# Patient Record
Sex: Female | Born: 1985 | Race: White | Hispanic: No | Marital: Married | Smoking: Never smoker
Health system: Southern US, Community
[De-identification: ages and names within clinical notes are randomized; demographics above are authoritative.]

## PROBLEM LIST (undated history)

## (undated) DIAGNOSIS — K219 Gastro-esophageal reflux disease without esophagitis: Secondary | ICD-10-CM

## (undated) DIAGNOSIS — E282 Polycystic ovarian syndrome: Secondary | ICD-10-CM

## (undated) DIAGNOSIS — I10 Essential (primary) hypertension: Secondary | ICD-10-CM

## (undated) HISTORY — PX: TYMPANOSTOMY TUBE PLACEMENT: SHX32

## (undated) HISTORY — DX: Polycystic ovarian syndrome: E28.2

---

## 2006-10-02 ENCOUNTER — Ambulatory Visit: Payer: Self-pay | Admitting: Family Medicine

## 2007-08-18 ENCOUNTER — Ambulatory Visit: Payer: Self-pay | Admitting: Family Medicine

## 2007-09-03 ENCOUNTER — Emergency Department: Payer: Self-pay | Admitting: Internal Medicine

## 2007-12-12 IMAGING — CT CT ABD-PELV W/ CM
2 of 3 series · 14 of 42 positions shown, 18 images · non-contrast
Comparison: none

REASON FOR EXAM: eval for appendicitis  CALL REPORT [id number]
COMMENTS:

[Series 2: appendicitis · axial · 0.67mm/px · z∈[-450,-48]mm · 11 of 154 slices shown, 15 images]
[im 13/154  soft-tissue]
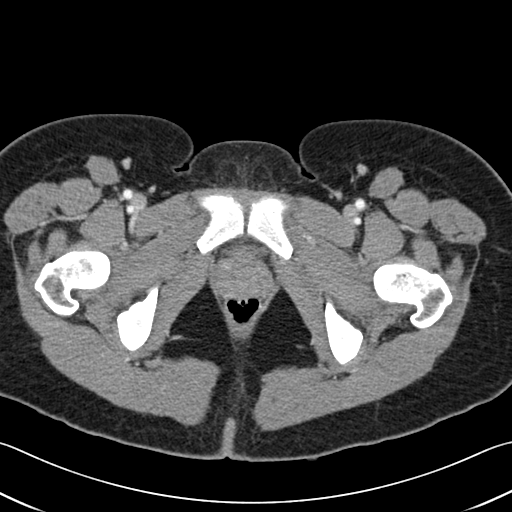
[im 13/154  bone]
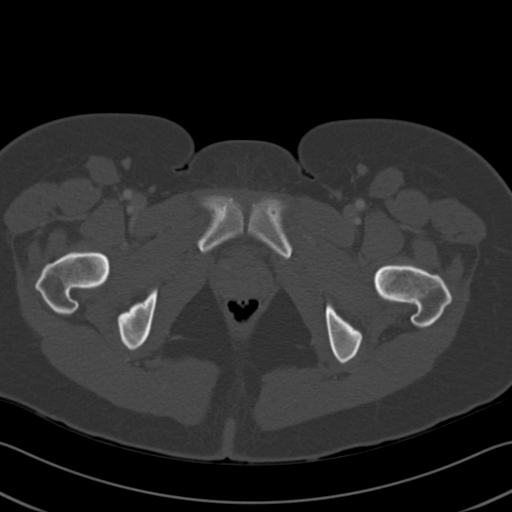
[im 26/154  soft-tissue]
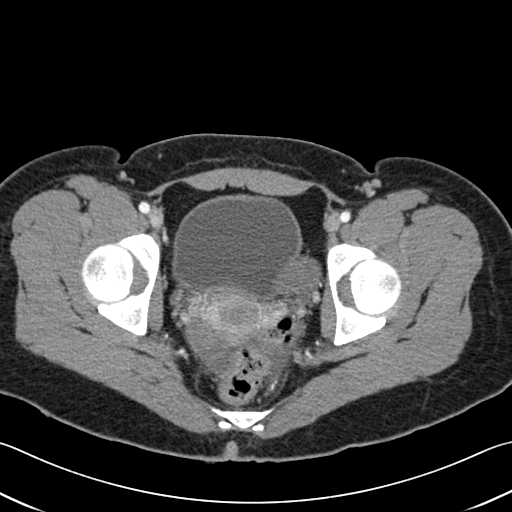
[im 45/154  soft-tissue]
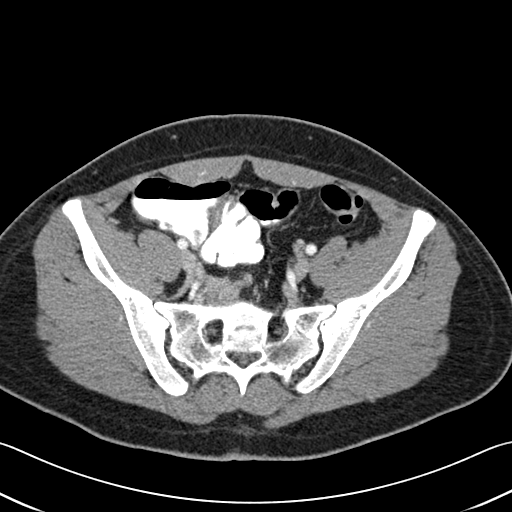
[im 58/154  soft-tissue]
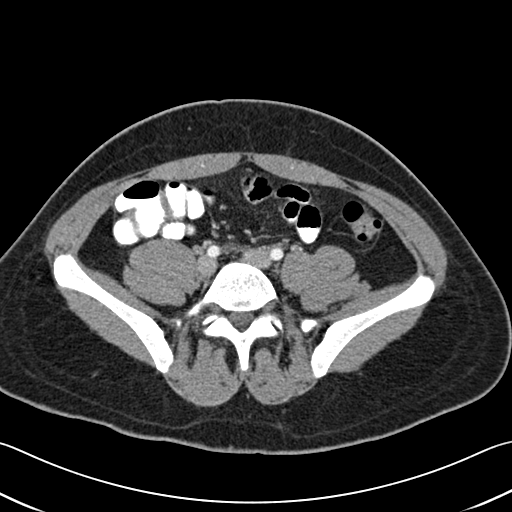
[im 77/154  soft-tissue]
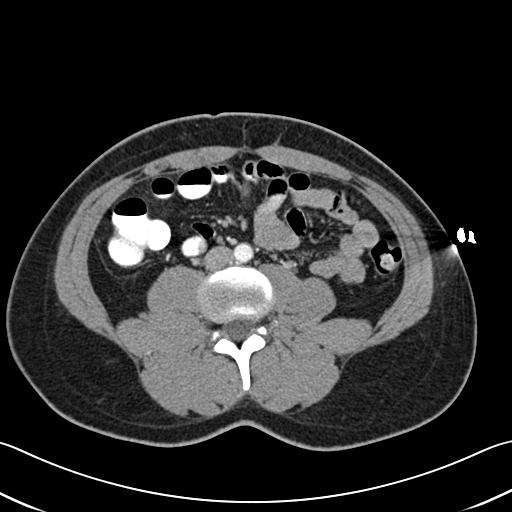
[im 96/154  soft-tissue]
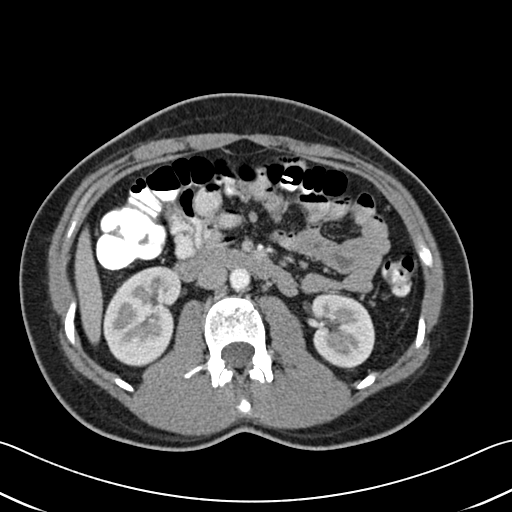
[im 109/154  soft-tissue]
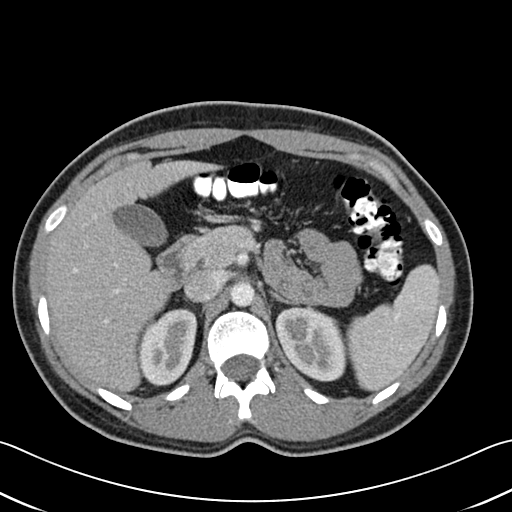
[im 128/154  soft-tissue]
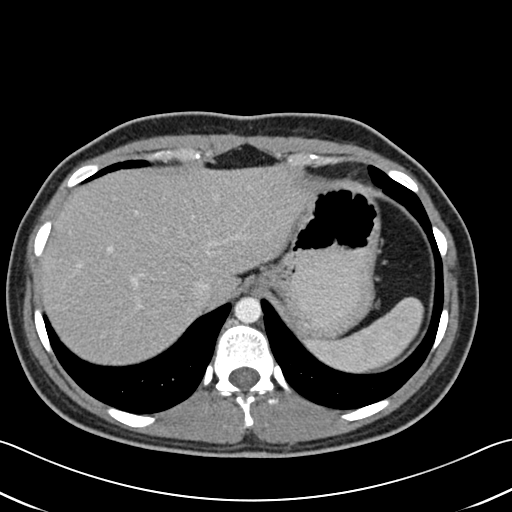
[im 128/154  lung]
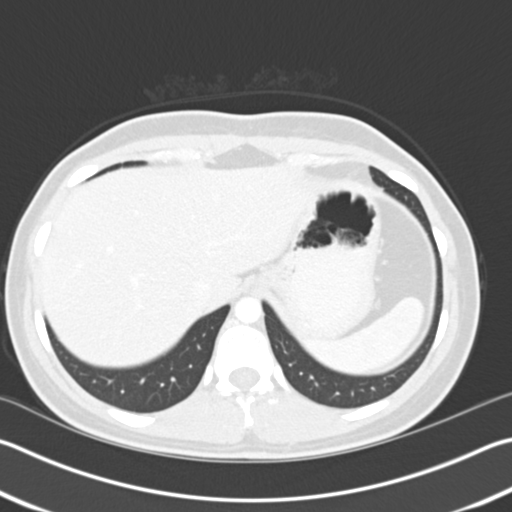
[im 134/154  lung]
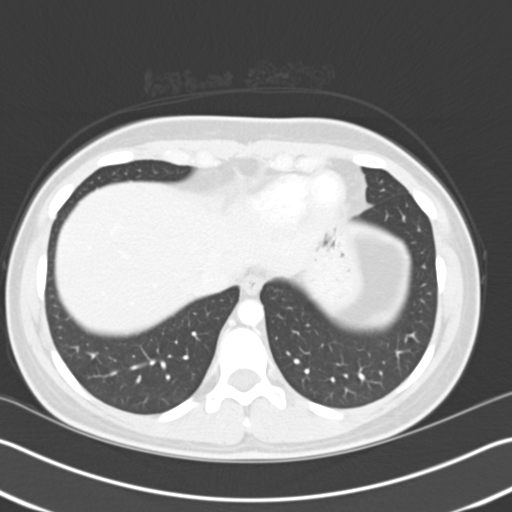
[im 141/154  soft-tissue]
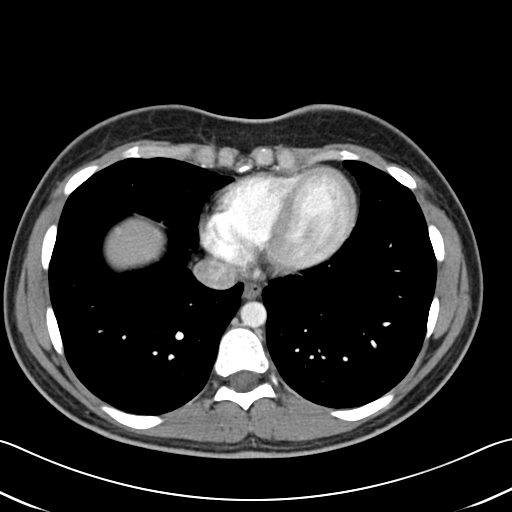
[im 141/154  lung]
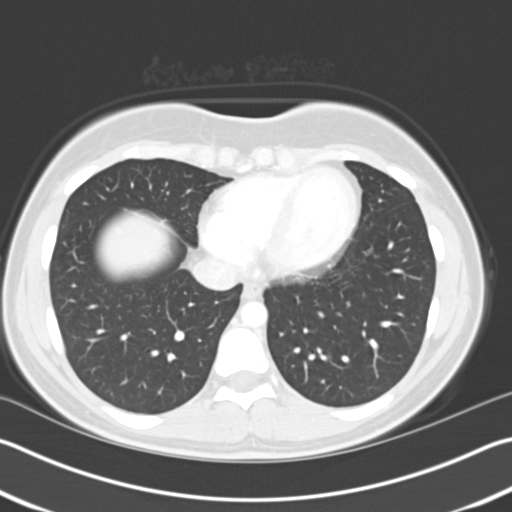
[im 141/154  bone]
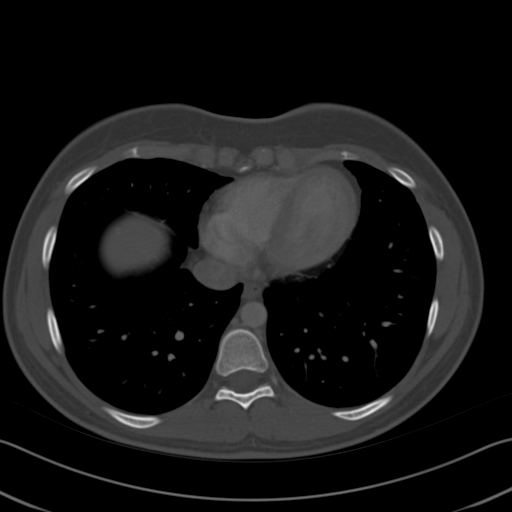
[im 147/154  lung]
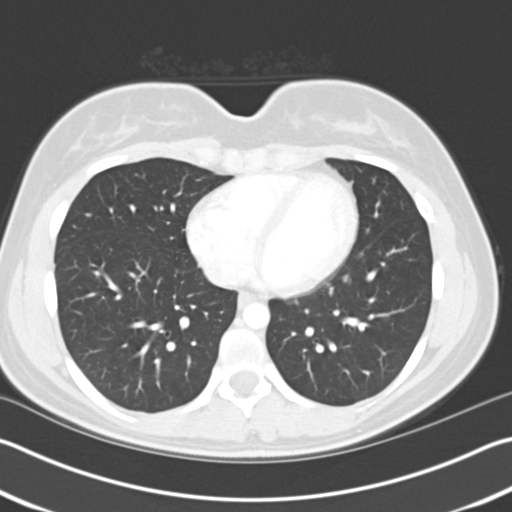

[Series 602: coronals · coronal · 0.92mm/px · 3 of 75 slices shown]
[im 25/75  soft-tissue]
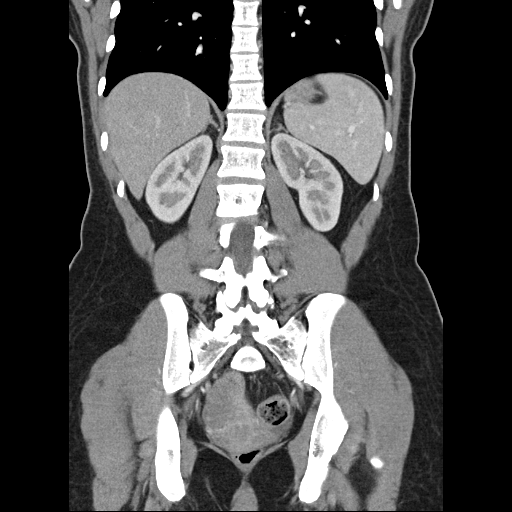
[im 33/75  soft-tissue]
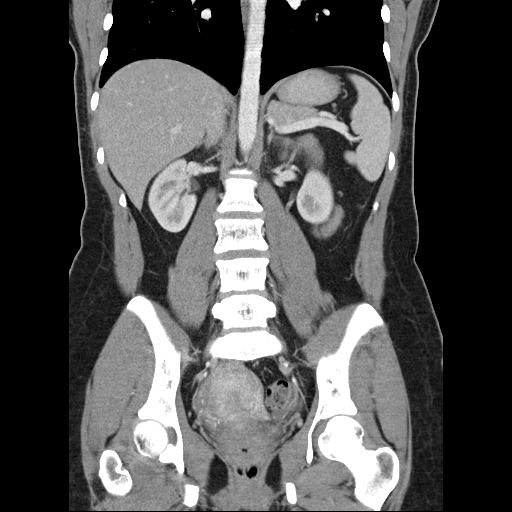
[im 42/75  soft-tissue]
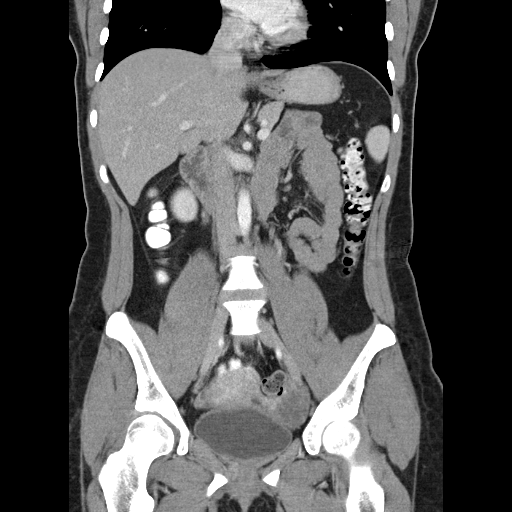

[14 of 42 positions shown; findings below may reference images not displayed]

PROCEDURE:     CT  - CT ABDOMEN / PELVIS  W  - October 02, 2006  [DATE]

RESULT:     The patient received 100 ml of Zsovue-I4D for this study as well
as oral contrast material. The patient is complaining of RIGHT lower
quadrant discomfort. Within the RIGHT lower quadrant of the abdomen, the
cecum is seen to be contrast-filled and normal in appearance. I do not see
pericecal inflammatory change. There is a structure which may reflect the
appendix which is normal. The cecum comes to lie in the midline in the upper
pelvis.

The partially distended urinary bladder is normal in appearance. There are
hypodensities in the adnexal region bilaterally. On the LEFT, this structure
measures 4.2 x 3.4 cm. On the RIGHT a similar hypodensity more posteriorly
positioned measures 4.8 x 1.9 cm. The uterus demonstrates relatively normal
size. There is a small amount of air present within the vaginal canal. The
sigmoid colon exhibits no acute abnormality.

There is no evidence of bowel obstruction.

Followup scanning is available if the patient's clinical condition warrants
this.

There are a few borderline enlarged mesenteric lymph nodes in the right
lower and mid abdomen. The caliber of the abdominal aorta is normal. The
liver, gallbladder, pancreas, adrenal glands, spleen, and partially
distended stomach are normal in appearance. The lung bases are clear.
IMPRESSION: 1. I do not see objective evidence of acute appendicitis.
2. There are complex appearing cystic structures in the adnexal regions
bilaterally. These are nonspecific and will merit further evaluation with
pelvic ultrasound. Correlation with the patient's laboratory values
including beta-hCG will be needed.
3. There is no evidence of ascites.
4. I see no acute abnormality of the urinary tracts nor of the hepatobiliary
tree.

This report was discussed by telephone with Dr. Shimi. A followup pelvic
ultrasound is planned to assess the adnexal regions. Followup CT scanning
may be indicated and continued close clinical followup will be needed.

## 2007-12-12 IMAGING — US US PELV - US TRANSVAGINAL
1 series · 17 of 25 positions shown · non-contrast
Comparison: none

REASON FOR EXAM: CT showed cyst questioning rupture RLQ pain elevated
White cell count  [id number]
COMMENTS:

[Series 1: us pelv - us transvaginal · 17 of 37 slices shown]
[im 1/37]
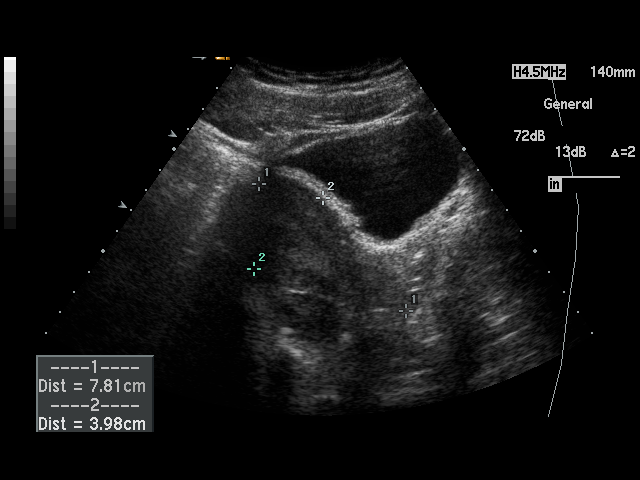
[im 4/37]
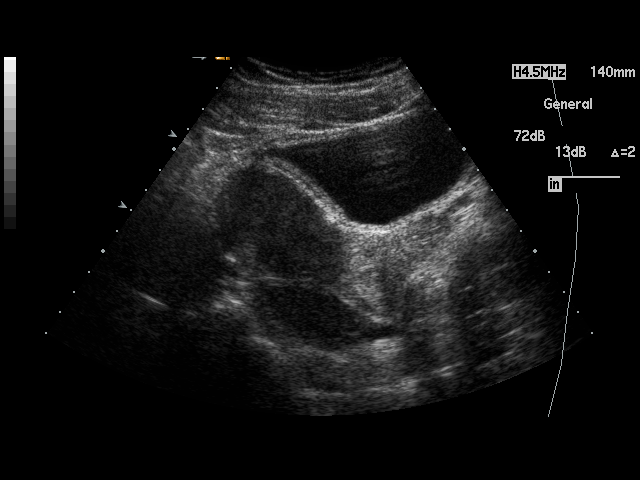
[im 5/37]
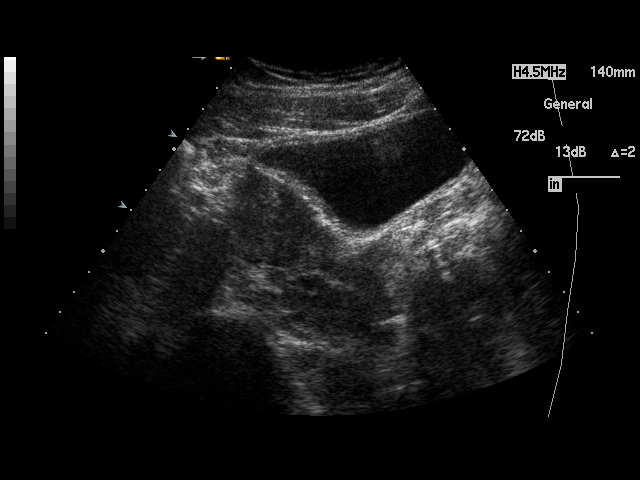
[im 8/37]
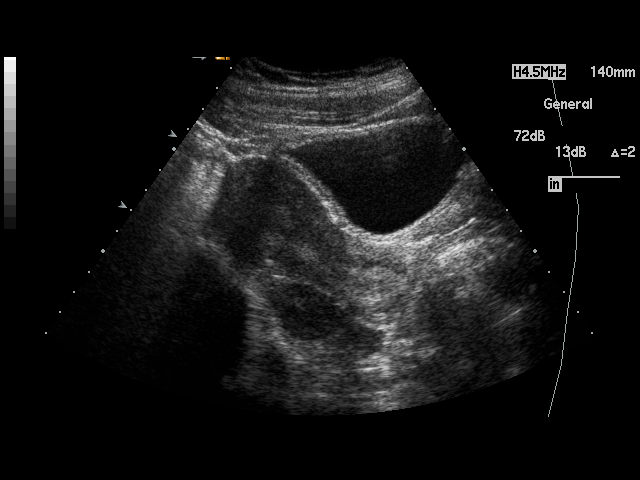
[im 10/37]
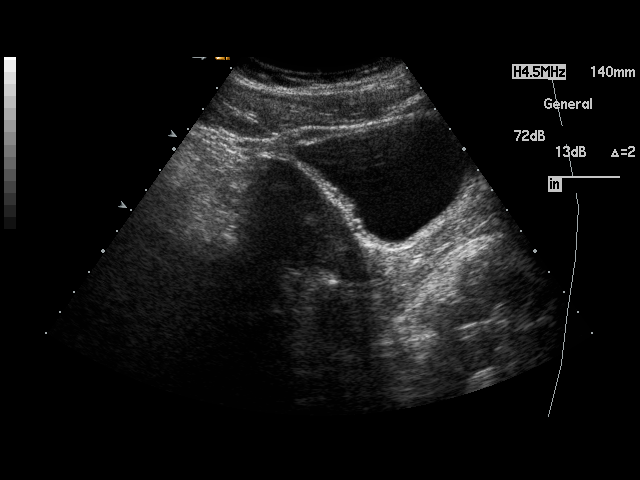
[im 13/37]
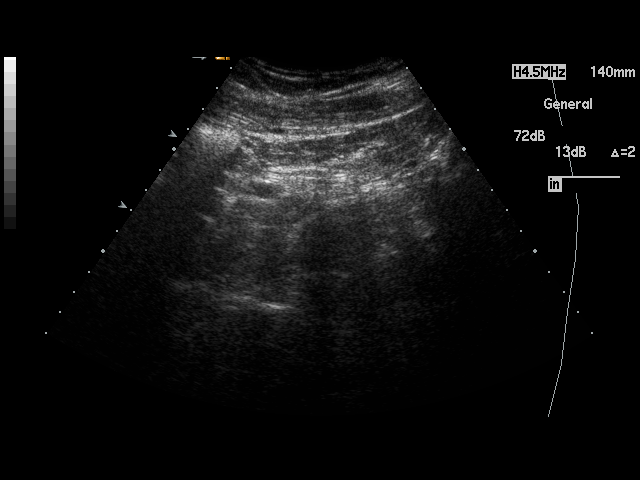
[im 14/37]
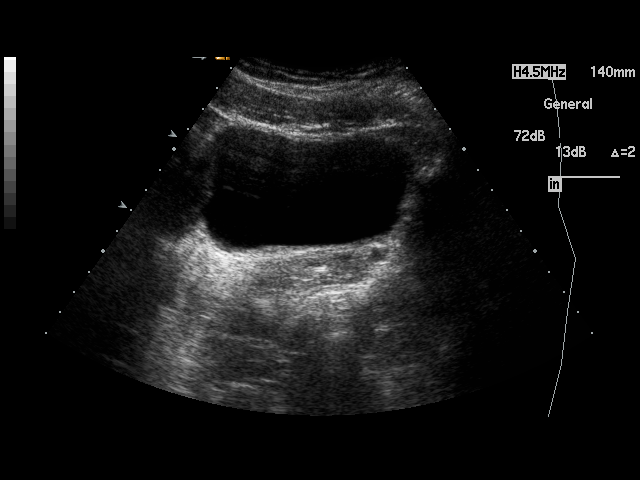
[im 17/37]
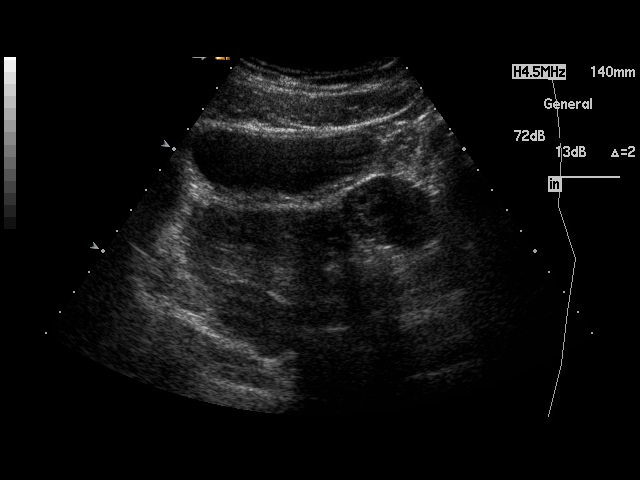
[im 19/37]
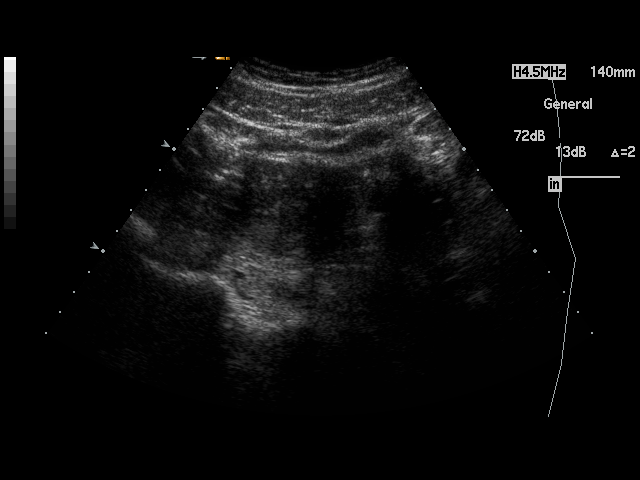
[im 20/37]
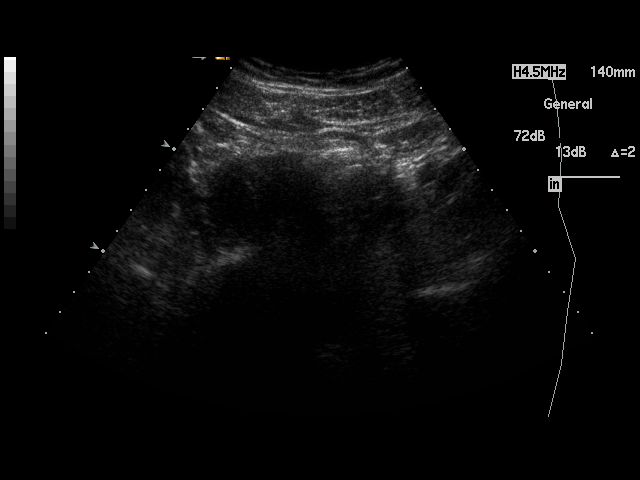
[im 23/37]
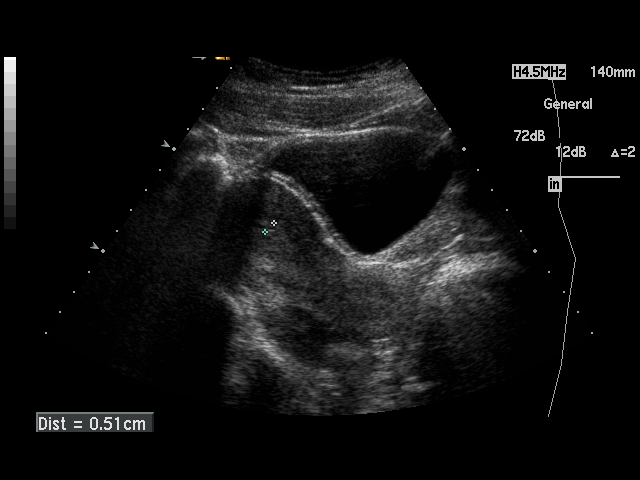
[im 25/37]
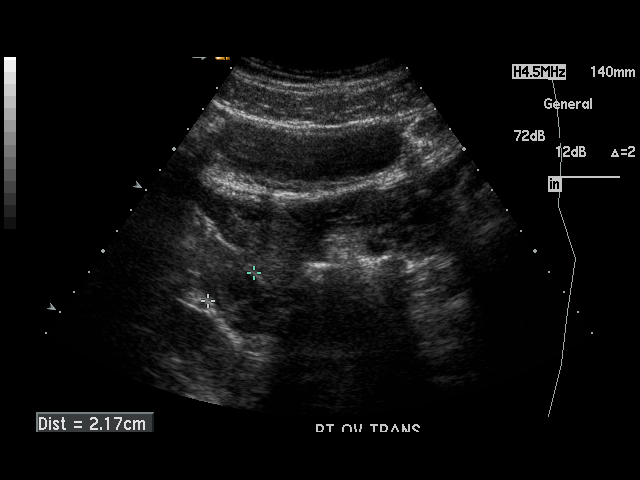
[im 28/37]
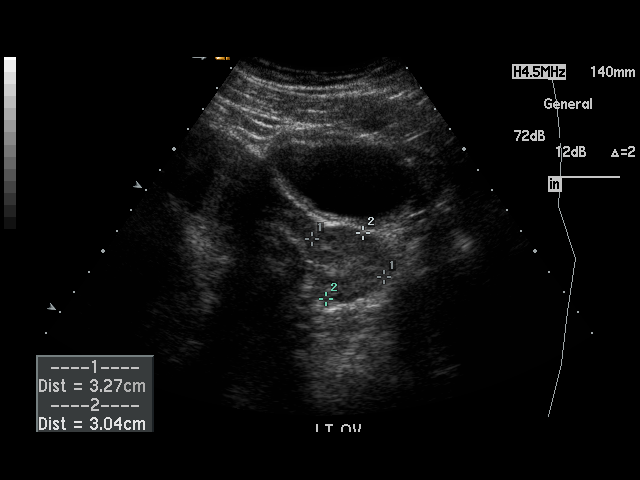
[im 29/37]
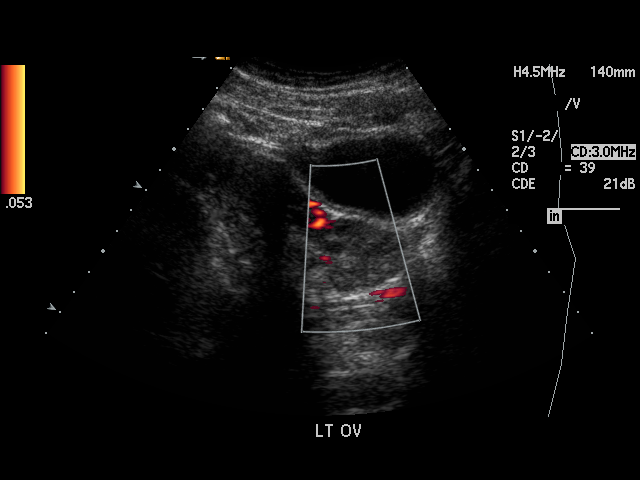
[im 32/37]
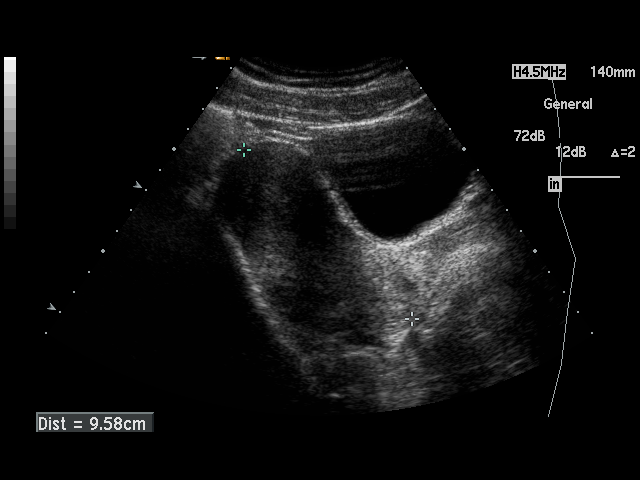
[im 34/37]
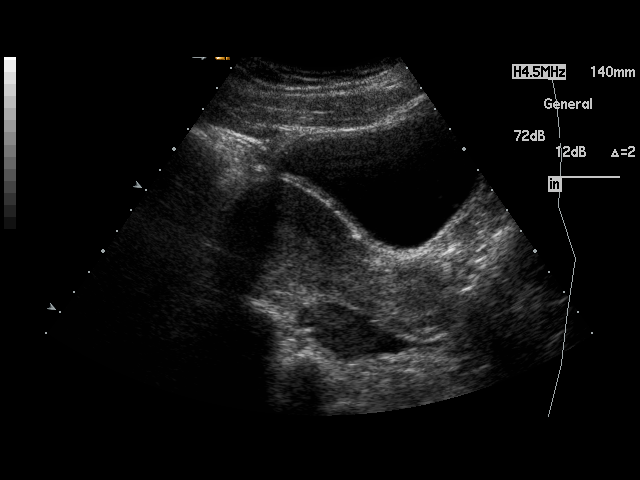
[im 37/37]
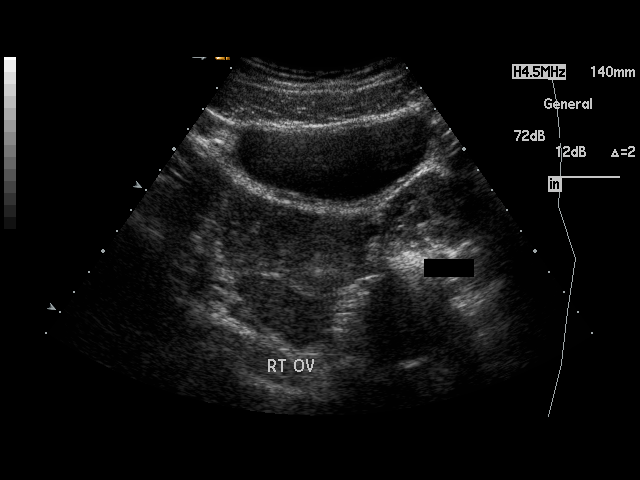

[17 of 25 positions shown; findings below may reference images not displayed]

PROCEDURE:     US  - US PELVIS MASS EXAM  - [DATE] [DATE] [DATE]  [DATE]

RESULT:     The uterus measures 9.58 cm x 3.89 cm x 4.42 cm.  The
endometrium measures 5.1 mm in thickness. No uterine mass lesions are seen.
The RIGHT and LEFT ovaries are visualized. The RIGHT ovary measures 3.54 cm
at maximum diameter and the LEFT ovary measures 3.63cm at maximum diameter.
No abnormal adnexal masses are. There is a nonspecific trace of free fluid
in the cul-de-sac. The visualized portion of the urinary bladder is normal
in appearance.
IMPRESSION: No significant abnormalities are identified. There is a nonspecific trace of
fluid in the cul-de-sac. The possible pelvic cystic structures noted on the
CT of this date are not identified sonographically.

## 2008-11-12 IMAGING — US US PELV - US TRANSVAGINAL
1 series · 17 of 25 positions shown · non-contrast
Comparison: Time 6361

REASON FOR EXAM: pain rlq        rm 7
COMMENTS:

PROCEDURE:     US  - US PELVIS MASS EXAM W/TRANSVAGI  - September 03, 2007  [DATE]
RESULT:     History: 22-year-old female presents for abdominal pain

[Series 1: us pelv - us transvaginal · 17 of 76 slices shown]
[im 1/76]
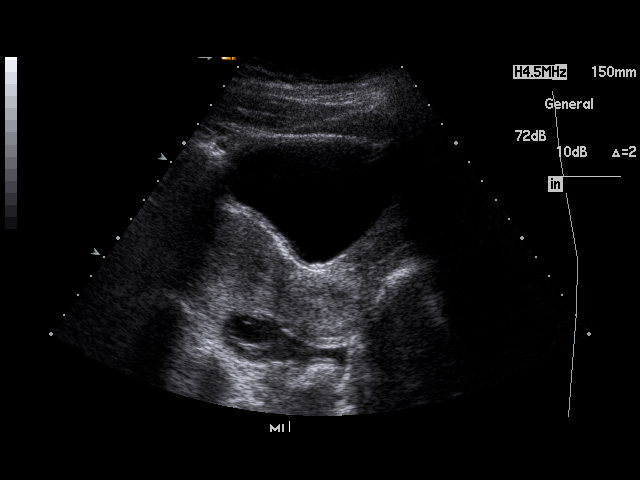
[im 7/76]
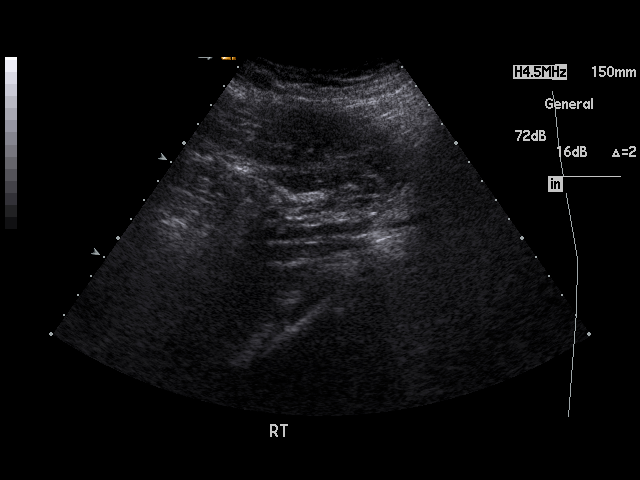
[im 10/76]
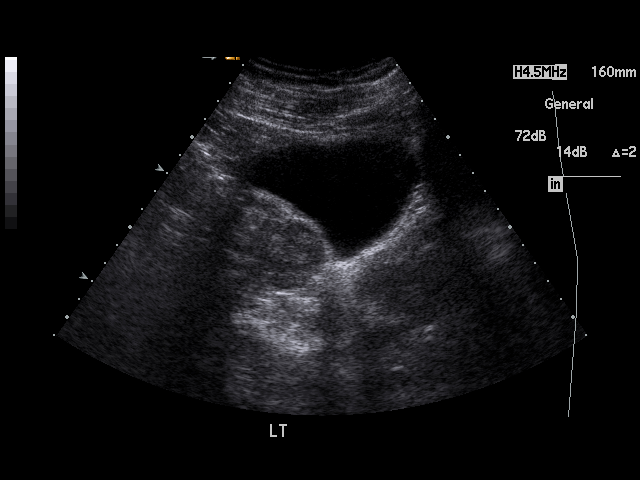
[im 16/76]
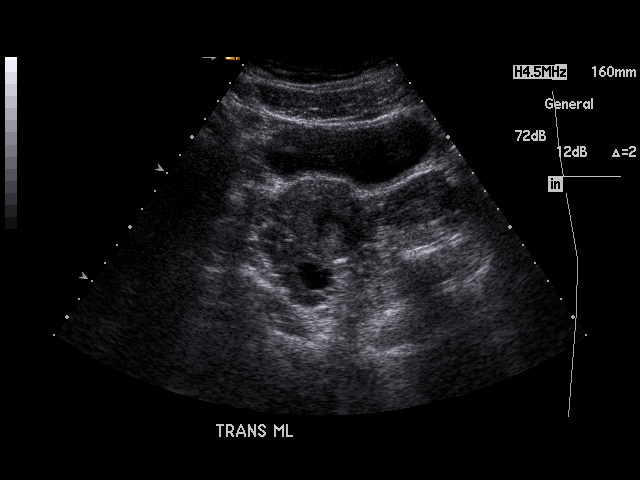
[im 19/76]
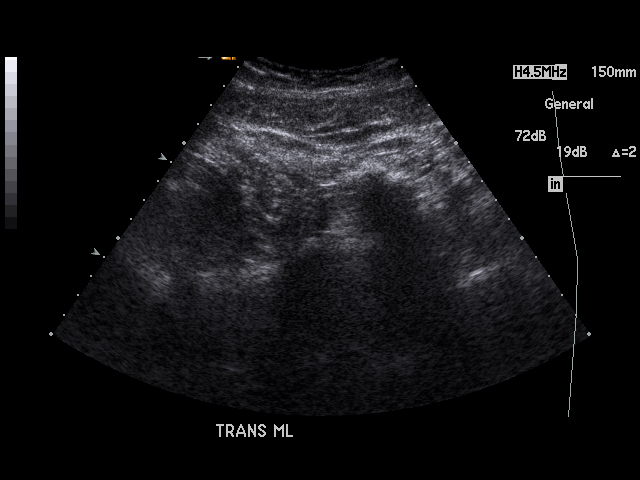
[im 26/76]
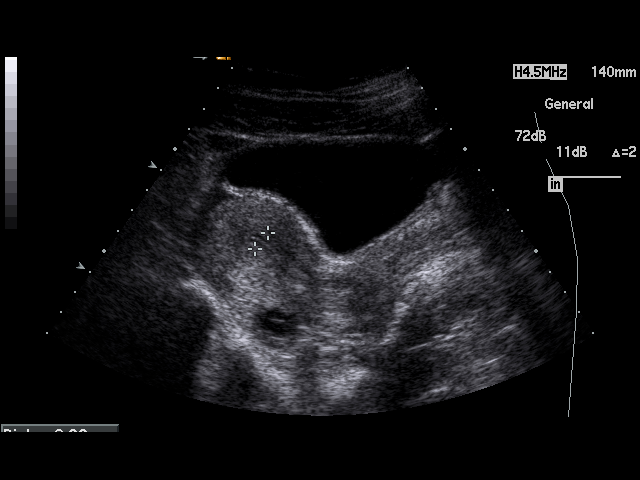
[im 29/76]
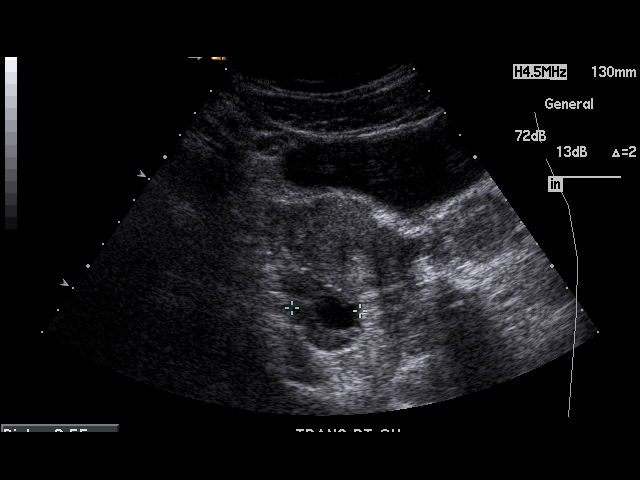
[im 35/76]
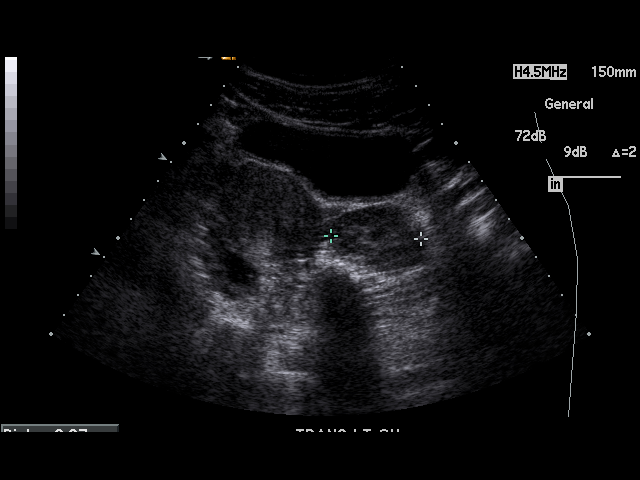
[im 38/76]
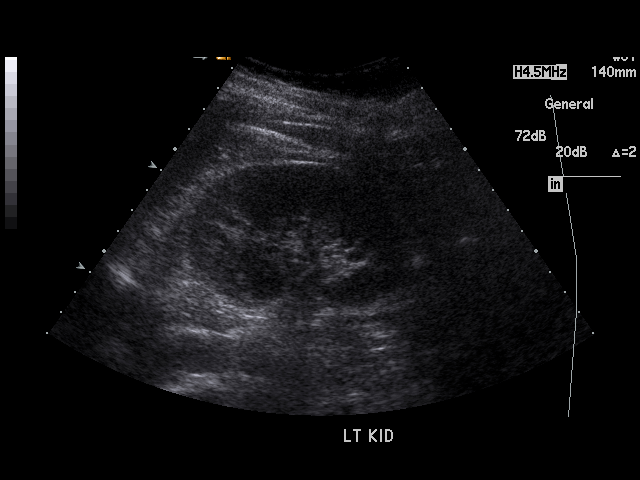
[im 41/76]
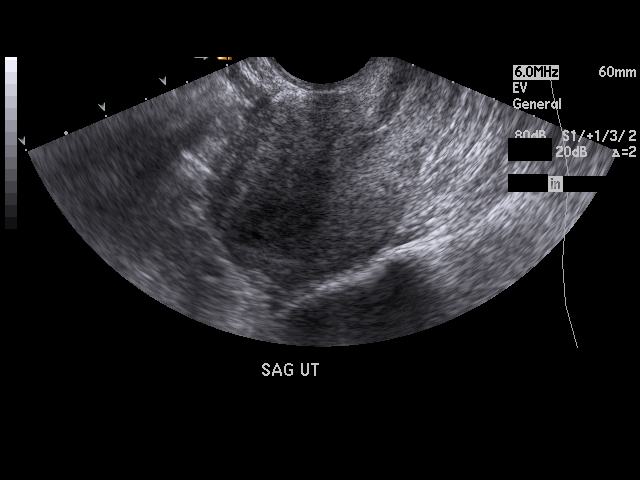
[im 47/76]
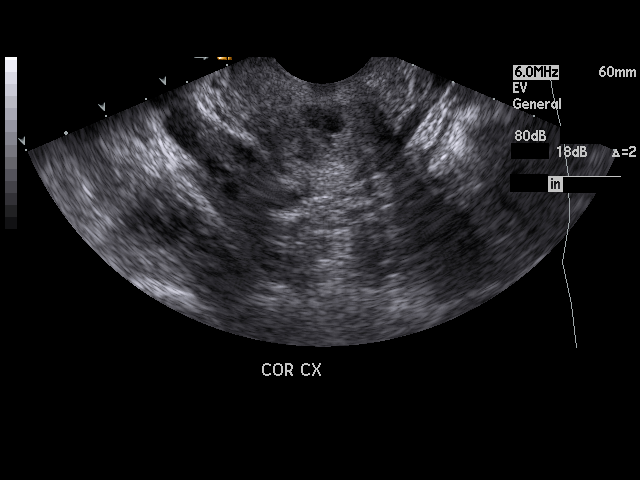
[im 51/76]
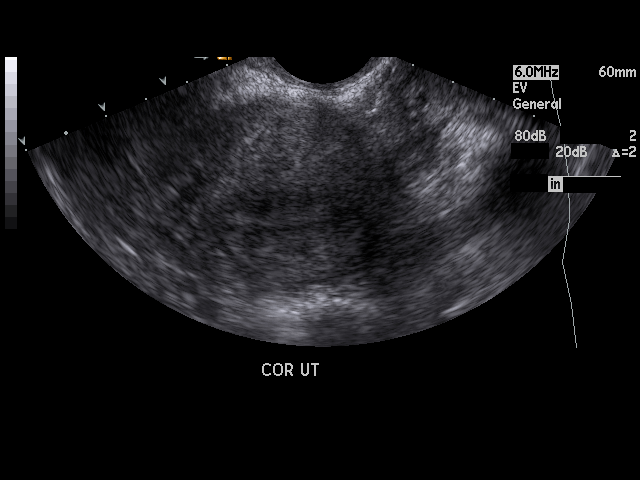
[im 57/76]
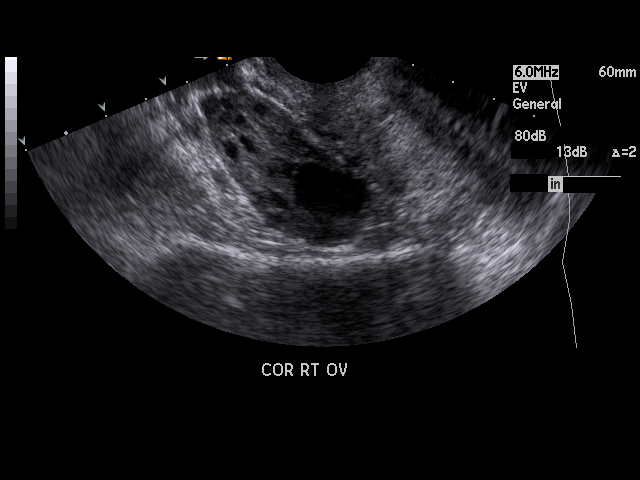
[im 60/76]
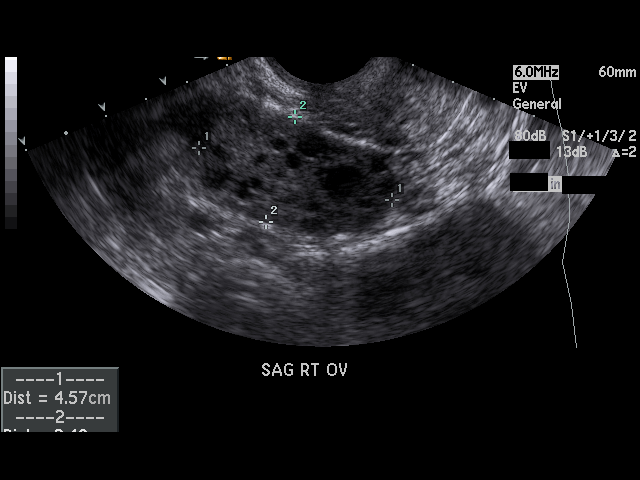
[im 66/76]
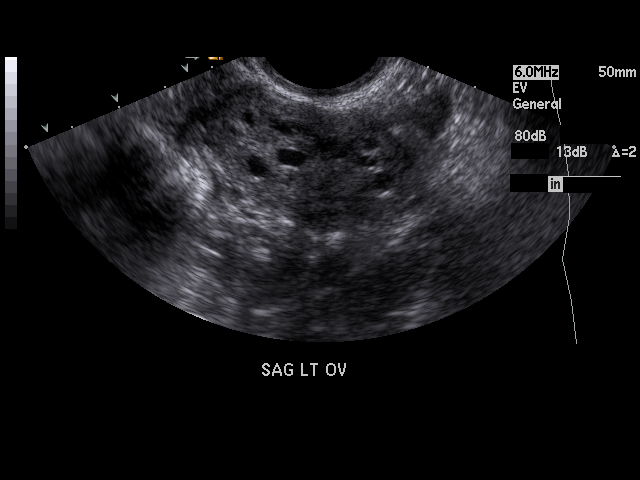
[im 69/76]
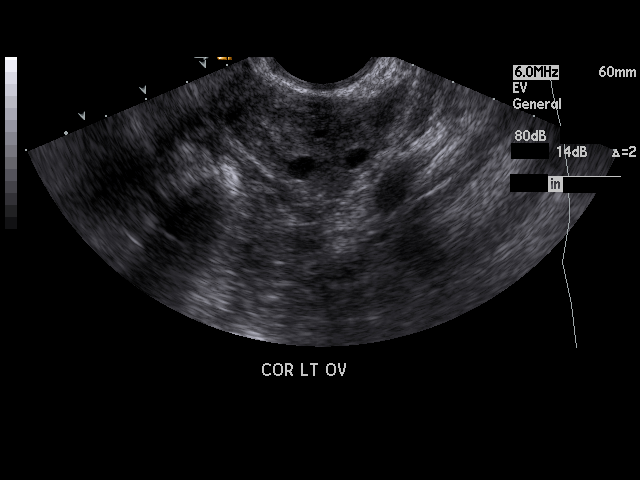
[im 76/76]
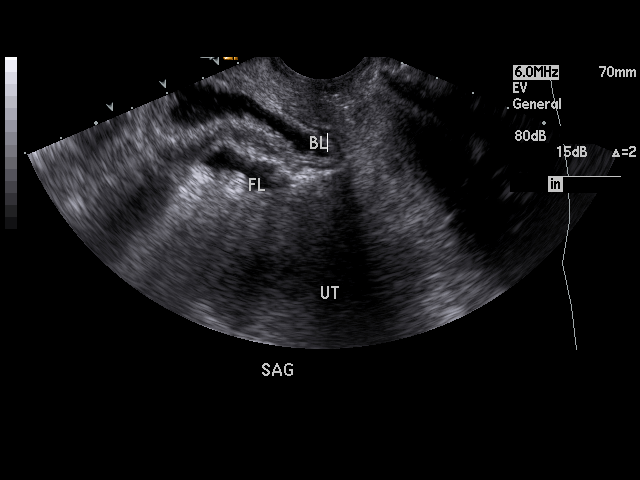

[17 of 25 positions shown; findings below may reference images not displayed]

FINDINGS: Real-time transabdominal and transvaginal ultrasound of the pelvis
performed.

Left nostril. The probe.

The uterus is normal and size and contour. The uterus measures 7.7 x 4 x
cm. The endometrial stripe is normal measuring 5.2 mm.

Bilateral ovaries are normal in size and echotexture. The right ovary
measures 4.6 x 1.9 x 2.5 cm. The left ovary measures 4.1 x 3.7 x 2.4 cm.
There is no evidence of an ovarian mass. There is a dominant right ovarian
follicle measuring 1.8 x 1.7 x 1.2 cm. There is a trace amount of fluid in
the cul-de-sac.
IMPRESSION: Unremarkable pelvic ultrasound trace amount of pelvic free fluid which may
be physiologic.

## 2011-02-04 HISTORY — PX: TONSILLECTOMY: SUR1361

## 2011-04-02 ENCOUNTER — Ambulatory Visit: Payer: Self-pay | Admitting: Otolaryngology

## 2013-05-23 ENCOUNTER — Ambulatory Visit: Payer: Self-pay | Admitting: Internal Medicine

## 2015-04-13 ENCOUNTER — Encounter: Payer: Self-pay | Admitting: Family Medicine

## 2015-04-13 ENCOUNTER — Ambulatory Visit (INDEPENDENT_AMBULATORY_CARE_PROVIDER_SITE_OTHER): Payer: BLUE CROSS/BLUE SHIELD | Admitting: Family Medicine

## 2015-04-13 VITALS — BP 118/88 | HR 90 | Temp 98.0°F | Resp 20 | Ht 64.0 in | Wt 180.5 lb

## 2015-04-13 DIAGNOSIS — Z7189 Other specified counseling: Secondary | ICD-10-CM

## 2015-04-13 DIAGNOSIS — Z7689 Persons encountering health services in other specified circumstances: Secondary | ICD-10-CM

## 2015-04-13 NOTE — Progress Notes (Signed)
Name: Jill Colon   MRN: [id number]    DOB: Nov 06, 1985   Date:04/13/2015       Progress Note  Subjective  Chief Complaint  Chief Complaint  Patient presents with  . est care    HPI  Pt. Is here to establish care. Last PCP was Dr. Oran Rein. Records not available for review  Past Medical History  Diagnosis Date  . PCOS (polycystic ovarian syndrome)     Past Surgical History  Procedure Laterality Date  . Tonsillectomy  2013  . Tympanostomy tube placement      Twice during childhood    Family History  Problem Relation Age of Onset  . Hypertension Mother   . Hyperlipidemia Father   . Hypertension Father   . Anemia Father     Social History   Social History  . Marital Status: Single    Spouse Name: N/A  . Number of Children: N/A  . Years of Education: N/A   Occupational History  . Not on file.   Social History Main Topics  . Smoking status: Never Smoker   . Smokeless tobacco: Not on file  . Alcohol Use: 0.6 oz/week    0 Standard drinks or equivalent, 1 Glasses of wine per week  . Drug Use: No  . Sexual Activity: Yes   Other Topics Concern  . Not on file   Social History Narrative  . No narrative on file    No current outpatient prescriptions on file.  Allergies  Allergen Reactions  . Augmentin [Amoxicillin-Pot Clavulanate] Rash     ROS    Objective  Filed Vitals:   04/13/15 1020  BP: 118/88  Pulse: 90  Temp: 98 F (36.7 C)  Resp: 20  Height: 5\' 4"  (1.626 m)  Weight: 180 lb 8 oz (81.874 kg)  SpO2: 97%    Physical Exam  Constitutional: She is well-developed, well-nourished, and in no distress.  HENT:  Head: Normocephalic and atraumatic.  Cardiovascular: Normal rate and regular rhythm.   Murmur heard.  Systolic murmur is present with a grade of 2/6  Pulmonary/Chest: Breath sounds normal.  Nursing note and vitals reviewed.   Assessment & Plan  1. Encounter to establish care with new doctor Request records. Patient will  return for a complete physical exam.   Jill Colon [facility] 04/13/2015 10:40 AM

## 2015-04-27 ENCOUNTER — Ambulatory Visit (INDEPENDENT_AMBULATORY_CARE_PROVIDER_SITE_OTHER): Payer: BLUE CROSS/BLUE SHIELD | Admitting: Family Medicine

## 2015-04-27 ENCOUNTER — Encounter: Payer: Self-pay | Admitting: Family Medicine

## 2015-04-27 VITALS — BP 118/80 | HR 100 | Temp 99.0°F | Resp 18 | Ht 64.0 in | Wt 179.2 lb

## 2015-04-27 DIAGNOSIS — R0981 Nasal congestion: Secondary | ICD-10-CM | POA: Diagnosis not present

## 2015-04-27 DIAGNOSIS — Z0000 Encounter for general adult medical examination without abnormal findings: Secondary | ICD-10-CM | POA: Diagnosis not present

## 2015-04-27 MED ORDER — AZITHROMYCIN 250 MG PO TABS: ORAL_TABLET | ORAL | Status: DC

## 2015-04-27 NOTE — Progress Notes (Signed)
Name: Jill Colon   MRN: [id number]    DOB: 1985/03/23   Date:04/27/2015       Progress Note  Subjective  Chief Complaint  Chief Complaint  Patient presents with  . Annual Exam    HPI  Pt. Presents for a Complete Physical exam.  Her last Pap Smear was  5 years ago. She has history of PCOS, was being followed by Gynecology (three Gynecologists at [place]).  Patient also requesting prescription for Z-Pak as she believes she may have sinus and nasal inflammation, if left untreated, this leads to a full-blown sinus infection. In the past, she has taken Z-Pak for similar symptoms, which helped relieve her symptoms. Past Medical History  Diagnosis Date  . PCOS (polycystic ovarian syndrome)     Past Surgical History  Procedure Laterality Date  . Tonsillectomy  2013  . Tympanostomy tube placement      Twice during childhood    Family History  Problem Relation Age of Onset  . Hypertension Mother   . Hyperlipidemia Father   . Hypertension Father   . Anemia Father     Social History   Social History  . Marital Status: Single    Spouse Name: N/A  . Number of Children: N/A  . Years of Education: N/A   Occupational History  . Not on file.   Social History Main Topics  . Smoking status: Never Smoker   . Smokeless tobacco: Not on file  . Alcohol Use: 0.6 oz/week    0 Standard drinks or equivalent, 1 Glasses of wine per week  . Drug Use: No  . Sexual Activity: Yes   Other Topics Concern  . Not on file   Social History Narrative    No current outpatient prescriptions on file.  Allergies  Allergen Reactions  . Augmentin [Amoxicillin-Pot Clavulanate] Rash     Review of Systems  Constitutional: Negative for fever, chills and malaise/fatigue.  HENT: Positive for congestion.   Eyes: Negative for blurred vision, double vision and pain.  Respiratory: Positive for cough. Negative for shortness of breath and wheezing.   Cardiovascular: Negative for chest  pain and palpitations.  Gastrointestinal: Negative for heartburn, nausea and vomiting.  Genitourinary: Negative for dysuria, urgency and hematuria.  Musculoskeletal: Negative for myalgias and back pain.  Neurological: Positive for headaches (headache last night). Negative for dizziness.  Psychiatric/Behavioral: Negative for depression. The patient is not nervous/anxious.     Objective  Filed Vitals:   04/27/15 1114  BP: 118/80  Pulse: 100  Temp: 99 F (37.2 C)  Resp: 18  Height: 5\' 4"  (1.626 m)  Weight: 179 lb 4 oz (81.307 kg)  SpO2: 98%    Physical Exam  Constitutional: She is oriented to person, place, and time and well-developed, well-nourished, and in no distress.  HENT:  Head: Normocephalic and atraumatic.  Left Ear: Tympanic membrane and ear canal normal.  Nose: Right sinus exhibits no maxillary sinus tenderness and no frontal sinus tenderness. Left sinus exhibits no maxillary sinus tenderness and no frontal sinus tenderness.  Mouth/Throat: No posterior oropharyngeal erythema.  R. Ear TM not visualized, canal normal, no discharge Nasal mucosal inflammation and turbinate hypertrophy.  Cardiovascular: Normal rate and regular rhythm.   Pulmonary/Chest: Effort normal and breath sounds normal. She has no wheezes. She has no rhonchi.  Abdominal: Soft. Bowel sounds are normal. There is no tenderness.  Genitourinary: Vagina normal, uterus normal and cervix normal. Vagina exhibits normal mucosa and no lesion. No vaginal discharge found.  Musculoskeletal:       Right ankle: She exhibits no swelling.       Left ankle: She exhibits no swelling.  Neurological: She is alert and oriented to person, place, and time.  Nursing note and vitals reviewed.     Assessment & Plan  1. Annual physical exam Obtained age-appropriate laboratory screening studies. - Pap IG w/ reflex to HPV when ASC-U - Lipid Profile - CBC with Differential - Comprehensive Metabolic Panel (CMET) - TSH -  Vitamin D (25 hydroxy)  2. Nasal sinus congestion We'll provide prescription for Z-Pak, but advised patient to only take if her symptoms progress despite conservative therapy. Pt. Verbalized agreement. - azithromycin (ZITHROMAX) 250 MG tablet; 2 tabs po x day 1, then 1 tab po q day x 4 days  Dispense: 6 tablet; Refill: 0    Ladona Rosten [facility] 04/27/2015 11:57 AM

## 2015-05-01 LAB — PAP IG W/ RFLX HPV ASCU: PAP Smear Comment: 0

## 2015-05-09 LAB — CBC WITH DIFFERENTIAL/PLATELET
BASOS: 0 %
Basophils Absolute: 0 10*3/uL (ref 0.0–0.2)
EOS (ABSOLUTE): 0.3 10*3/uL (ref 0.0–0.4)
EOS: 3 %
HEMATOCRIT: 38.6 % (ref 34.0–46.6)
Hemoglobin: 12.6 g/dL (ref 11.1–15.9)
IMMATURE GRANS (ABS): 0 10*3/uL (ref 0.0–0.1)
IMMATURE GRANULOCYTES: 0 %
LYMPHS: 33 %
Lymphocytes Absolute: 2.9 10*3/uL (ref 0.7–3.1)
MCH: 26.8 pg (ref 26.6–33.0)
MCHC: 32.6 g/dL (ref 31.5–35.7)
MCV: 82 fL (ref 79–97)
MONOS ABS: 0.5 10*3/uL (ref 0.1–0.9)
Monocytes: 6 %
NEUTROS PCT: 58 %
Neutrophils Absolute: 5 10*3/uL (ref 1.4–7.0)
PLATELETS: 332 10*3/uL (ref 150–379)
RBC: 4.7 x10E6/uL (ref 3.77–5.28)
RDW: 14.2 % (ref 12.3–15.4)
WBC: 8.7 10*3/uL (ref 3.4–10.8)

## 2015-05-09 LAB — COMPREHENSIVE METABOLIC PANEL
A/G RATIO: 1.5 (ref 1.2–2.2)
ALT: 13 IU/L (ref 0–32)
AST: 13 IU/L (ref 0–40)
Albumin: 4.4 g/dL (ref 3.5–5.5)
Alkaline Phosphatase: 38 IU/L — ABNORMAL LOW (ref 39–117)
BUN/Creatinine Ratio: 19 (ref 9–23)
BUN: 14 mg/dL (ref 6–20)
Bilirubin Total: 0.3 mg/dL (ref 0.0–1.2)
CALCIUM: 9.5 mg/dL (ref 8.7–10.2)
CO2: 22 mmol/L (ref 18–29)
CREATININE: 0.74 mg/dL (ref 0.57–1.00)
Chloride: 100 mmol/L (ref 96–106)
GFR, EST AFRICAN AMERICAN: 127 mL/min/{1.73_m2} (ref >59)
GFR, EST NON AFRICAN AMERICAN: 110 mL/min/{1.73_m2} (ref >59)
Globulin, Total: 2.9 g/dL (ref 1.5–4.5)
Glucose: 104 mg/dL — ABNORMAL HIGH (ref 65–99)
POTASSIUM: 4.7 mmol/L (ref 3.5–5.2)
Sodium: 138 mmol/L (ref 134–144)
TOTAL PROTEIN: 7.3 g/dL (ref 6.0–8.5)

## 2015-05-09 LAB — LIPID PANEL
CHOLESTEROL TOTAL: 170 mg/dL (ref 100–199)
Chol/HDL Ratio: 3.1 ratio units (ref 0.0–4.4)
HDL: 55 mg/dL (ref >39)
LDL Calculated: 100 mg/dL — ABNORMAL HIGH (ref 0–99)
Triglycerides: 77 mg/dL (ref 0–149)
VLDL Cholesterol Cal: 15 mg/dL (ref 5–40)

## 2015-05-09 LAB — VITAMIN D 25 HYDROXY (VIT D DEFICIENCY, FRACTURES): VIT D 25 HYDROXY: 40 ng/mL (ref 30.0–100.0)

## 2015-05-09 LAB — TSH: TSH: 3.23 u[IU]/mL (ref 0.450–4.500)

## 2015-06-08 ENCOUNTER — Ambulatory Visit: Payer: BLUE CROSS/BLUE SHIELD | Admitting: Family Medicine

## 2016-05-13 ENCOUNTER — Encounter: Payer: Self-pay | Admitting: Family Medicine

## 2016-05-13 ENCOUNTER — Ambulatory Visit (INDEPENDENT_AMBULATORY_CARE_PROVIDER_SITE_OTHER): Payer: BLUE CROSS/BLUE SHIELD | Admitting: Family Medicine

## 2016-05-13 VITALS — BP 126/81 | HR 96 | Temp 97.3°F | Resp 17 | Ht 64.0 in | Wt 195.7 lb

## 2016-05-13 DIAGNOSIS — R739 Hyperglycemia, unspecified: Secondary | ICD-10-CM | POA: Diagnosis not present

## 2016-05-13 DIAGNOSIS — R5383 Other fatigue: Secondary | ICD-10-CM | POA: Diagnosis not present

## 2016-05-13 LAB — CBC WITH DIFFERENTIAL/PLATELET
BASOS PCT: 1 %
Basophils Absolute: 101 cells/uL (ref 0–200)
Eosinophils Absolute: 202 cells/uL (ref 15–500)
Eosinophils Relative: 2 %
HEMATOCRIT: 38.2 % (ref 35.0–45.0)
HEMOGLOBIN: 12.5 g/dL (ref 11.7–15.5)
LYMPHS ABS: 2323 {cells}/uL (ref 850–3900)
Lymphocytes Relative: 23 %
MCH: 26.9 pg — ABNORMAL LOW (ref 27.0–33.0)
MCHC: 32.7 g/dL (ref 32.0–36.0)
MCV: 82.2 fL (ref 80.0–100.0)
MONO ABS: 505 {cells}/uL (ref 200–950)
MPV: 9.9 fL (ref 7.5–12.5)
Monocytes Relative: 5 %
NEUTROS ABS: 6969 {cells}/uL (ref 1500–7800)
Neutrophils Relative %: 69 %
Platelets: 402 10*3/uL — ABNORMAL HIGH (ref 140–400)
RBC: 4.65 MIL/uL (ref 3.80–5.10)
RDW: 14 % (ref 11.0–15.0)
WBC: 10.1 10*3/uL (ref 3.8–10.8)

## 2016-05-13 LAB — POCT GLYCOSYLATED HEMOGLOBIN (HGB A1C): HEMOGLOBIN A1C: 6

## 2016-05-13 LAB — TSH: TSH: 2.4 m[IU]/L

## 2016-05-13 NOTE — Progress Notes (Signed)
Name: Jill Colon   MRN: [id number]    DOB: 1985-11-18   Date:05/13/2016       Progress Note  Subjective  Chief Complaint  Chief Complaint  Patient presents with  . Follow-up    Thyroid check    HPI  Pt. Is here to have her thyroid levels checked, she has been feeling sluggish, fatigue, dry skin, and has gained some weight. She has no history of thyroid disorder in her family. TSH obtained in April 2017 was within normal range.   Past Medical History:  Diagnosis Date  . PCOS (polycystic ovarian syndrome)     Past Surgical History:  Procedure Laterality Date  . TONSILLECTOMY  2013  . TYMPANOSTOMY TUBE PLACEMENT     Twice during childhood    Family History  Problem Relation Age of Onset  . Hypertension Mother   . Hyperlipidemia Father   . Hypertension Father   . Anemia Father     Social History   Social History  . Marital status: Single    Spouse name: N/A  . Number of children: N/A  . Years of education: N/A   Occupational History  . Not on file.   Social History Main Topics  . Smoking status: Never Smoker  . Smokeless tobacco: Never Used  . Alcohol use 0.6 oz/week    1 Glasses of wine per week  . Drug use: No  . Sexual activity: Yes   Other Topics Concern  . Not on file   Social History Narrative  . No narrative on file     Current Outpatient Prescriptions:  .  azithromycin (ZITHROMAX) 250 MG tablet, 2 tabs po x day 1, then 1 tab po q day x 4 days (Patient not taking: Reported on 05/13/2016), Disp: 6 tablet, Rfl: 0  Allergies  Allergen Reactions  . Augmentin [Amoxicillin-Pot Clavulanate] Rash     Review of Systems  Constitutional: Positive for malaise/fatigue. Negative for chills, fever and weight loss.  Respiratory: Negative for cough and shortness of breath.   Cardiovascular: Negative for chest pain and palpitations.  Gastrointestinal: Negative for abdominal pain.  Skin: Negative for rash.  Psychiatric/Behavioral: Negative for  depression. The patient is not nervous/anxious.       Objective  Vitals:   05/13/16 1023  BP: 126/81  Pulse: 96  Resp: 17  Temp: 97.3 F (36.3 C)  TempSrc: Oral  SpO2: 96%  Weight: 195 lb 11.2 oz (88.8 kg)  Height: 5\' 4"  (1.626 m)    Physical Exam  Constitutional: She is oriented to person, place, and time and well-developed, well-nourished, and in no distress.  HENT:  Head: Normocephalic and atraumatic.  Neck: Neck supple. No thyroid mass and no thyromegaly present.  Cardiovascular: Regular rhythm, S1 normal and S2 normal.  Tachycardia present.   Pulmonary/Chest: Effort normal and breath sounds normal. She has no wheezes. She has no rhonchi.  Abdominal: Soft. There is no tenderness.  Musculoskeletal:       Right ankle: She exhibits no swelling.       Left ankle: She exhibits no swelling.  Neurological: She is alert and oriented to person, place, and time.  Psychiatric: Mood, memory, affect and judgment normal.  Nursing note and vitals reviewed.      Assessment & Plan  1. Fatigue, unspecified type Unclear etiology, obtain TSH and CBC, follow-up after lab - TSH - CBC with Differential/Platelet  2. Hyperglycemia Point-of-care A1c 6.0%, consistent with prediabetes, - POCT glycosylated hemoglobin (Hb A1C)  Jill Colon Jill Colon 05/13/2016 10:32 AM

## 2016-05-21 ENCOUNTER — Telehealth: Payer: Self-pay

## 2016-05-21 DIAGNOSIS — D691 Qualitative platelet defects: Secondary | ICD-10-CM

## 2016-05-21 LAB — CBC WITH DIFFERENTIAL/PLATELET
Basophils Absolute: 0 cells/uL (ref 0–200)
Basophils Relative: 0 %
EOS ABS: 182 {cells}/uL (ref 15–500)
Eosinophils Relative: 2 %
HEMATOCRIT: 38.5 % (ref 35.0–45.0)
HEMOGLOBIN: 12.5 g/dL (ref 11.7–15.5)
LYMPHS ABS: 2639 {cells}/uL (ref 850–3900)
Lymphocytes Relative: 29 %
MCH: 26.8 pg — ABNORMAL LOW (ref 27.0–33.0)
MCHC: 32.5 g/dL (ref 32.0–36.0)
MCV: 82.6 fL (ref 80.0–100.0)
MONO ABS: 546 {cells}/uL (ref 200–950)
MPV: 10.3 fL (ref 7.5–12.5)
Monocytes Relative: 6 %
NEUTROS PCT: 63 %
Neutro Abs: 5733 cells/uL (ref 1500–7800)
Platelets: 370 10*3/uL (ref 140–400)
RBC: 4.66 MIL/uL (ref 3.80–5.10)
RDW: 14.1 % (ref 11.0–15.0)
WBC: 9.1 10*3/uL (ref 3.8–10.8)

## 2016-05-21 NOTE — Telephone Encounter (Signed)
Repeat CBC due to elevated platelets per Dr. Manuella Ghazi

## 2020-02-04 DIAGNOSIS — U071 COVID-19: Secondary | ICD-10-CM

## 2020-02-04 HISTORY — DX: COVID-19: U07.1

## 2020-08-20 ENCOUNTER — Encounter: Payer: Self-pay | Admitting: Emergency Medicine

## 2020-08-20 ENCOUNTER — Other Ambulatory Visit: Payer: Self-pay

## 2020-08-20 ENCOUNTER — Ambulatory Visit
Admission: EM | Admit: 2020-08-20 | Discharge: 2020-08-20 | Disposition: A | Discharge status: Home / Self Care | Payer: Managed Care, Other (non HMO) | Attending: Emergency Medicine | Admitting: Emergency Medicine

## 2020-08-20 DIAGNOSIS — J0100 Acute maxillary sinusitis, unspecified: Secondary | ICD-10-CM

## 2020-08-20 MED ORDER — CEFDINIR 300 MG PO CAPS: 300.0000 mg | ORAL_CAPSULE | Freq: Two times a day (BID) | ORAL | 0 refills | Status: AC

## 2020-08-20 NOTE — ED Provider Notes (Signed)
CHIEF COMPLAINT:   Chief Complaint  Patient presents with   Nasal Congestion   Sore Throat   Otalgia   Cough     SUBJECTIVE/HPI:   Sore Throat  Otalgia Associated symptoms: cough   Cough Associated symptoms: ear pain   A very pleasant 34 y.o.Female presents today with sinus pressure/pain, sore throat, nasal congestion, runny nose, cough x 3 days. Patient reports that "I know I have a sinus infection". She reports taking an at home COVID test which was negative. Patient states that she has been coughing up and blowing out thick yellow and brown mucus. No sick contacts. No fever, vomiting, or additional symptoms reported today.   has a past medical history of COVID-19 (02/2020) and PCOS (polycystic ovarian syndrome).  ROS:  Review of Systems  HENT:  Positive for ear pain.   Respiratory:  Positive for cough.   See Subjective/HPI Medications, Allergies and Problem List personally reviewed in Epic today OBJECTIVE:   Vitals:   08/20/20 0908  BP: (!) 144/96  Pulse: (!) 113  Resp: 20  Temp: 99.2 F (37.3 C)  SpO2: 98%    Physical Exam   General: Appears well-developed and well-nourished. No acute distress.  HEENT Head: Normocephalic and atraumatic.  + frontal, maxillary sinus and nasal bridge tenderness noted to palpation. Ears: Hearing grossly intact, no drainage or visible deformity.  Nose: No nasal deviation.  Erythematous and congested nasal mucosa noted bilaterally with rhinorrhea Mouth/Throat: No stridor or tracheal deviation.  Mildly erythematous posterior pharynx noted with clear drainage present.  No white patchy exudate noted. Eyes: Conjunctivae and EOM are normal. No eye drainage or scleral icterus bilaterally.  Neck: Normal range of motion, neck is supple. No cervical, tonsillar or submandibular lymph nodes palpated.   Cardiovascular: Normal rate. Regular rhythm; no murmurs, gallops, or rubs.  Pulm/Chest: No respiratory distress. Breath sounds normal bilaterally  without wheezes, rhonchi, or rales.  Neurological: Alert and oriented to person, place, and time.  Skin: Skin is warm and dry.  No rashes, lesions, abrasions or bruising noted to skin.   Psychiatric: Normal mood, affect, behavior, and thought content.   Vital signs and nursing note reviewed.   Patient stable and cooperative with examination. PROCEDURES:    LABS/X-RAYS/EKG/MEDS:   No results found for any visits on 08/20/20.  MEDICAL DECISION MAKING:   Patient presents with sinus pressure/pain, sore throat, nasal congestion, runny nose, cough x 3 days. Patient reports that "I know I have a sinus infection". She reports taking an at home COVID test which was negative. Patient states that she has been coughing up and blowing out thick yellow and brown mucus. No sick contacts. No fever, vomiting, or additional symptoms reported today. Chart review completed. Spoke with the patient about testing for COVID-19 for which she declined. She reports that this is following the same pattern as previous sinus infections. She is quite tender to her frontal and maxillary sinuses in clinic today. Gave a fold and hold prescription for Cefdinir for which I advised not to take or fill for 7 days and only if symptoms worsen. Advised of at home treatment and care as outlined in her AVS. Return as needed.  Patient verbalized understanding and agreed with treatment plan.  Patient stable upon discharge. ASSESSMENT/PLAN:  1. Acute maxillary sinusitis, recurrence not specified  Meds ordered this encounter  Medications   cefdinir (OMNICEF) 300 MG capsule    Sig: Take 1 capsule (300 mg total) by mouth 2 (two) times daily for  7 days.    Dispense:  14 capsule    Refill:  0    Order Specific Question:   Supervising Provider    Answer:   Chase Picket [id number]    Instructions about new medications and side effects provided.  Plan:   Discharge Instructions      DO NOT start taking your antibiotic  prescription unless symptoms do not improve over the next 1 week.  Sinusitis often follows a cold. It causes pain and pressure in your head and face. Rest, push lots of fluids (especially water), and utilize supportive care for symptoms. Breathe warm, moist area from a steamy shower, hot bath, or sink filled with hot water.  Avoid cold, dry air.  Using a humidifier in your home may help.  Follow the directions for cleaning the machine. Put a hot, wet towel or a warm gel pack on your face 3-4 times a day for 5-10 minutes each time. You may take acetaminophen (Tylenol) every 4-6 hours and ibuprofen every 6-8 hours for muscle pain, joint pain, headaches (you may also alternate these medications). Sudafed (pseudophedrine) is sold behind the counter and can help reduce nasal pressure; avoid taking this if you have high blood pressure or feel jittery. Sudafed PE (phenylephrine) can be a helpful, short-term, over-the-counter alternative to limit side effects or if you have high blood pressure.  Flonase nasal spray can help alleviate congestion and sinus pressure. Many patients choose Afrin as a nasal decongestant; do not use for more than 3 days for risk of rebound (increased symptoms after stopping medication).  Saline nasal sprays or rinses can also help nasal congestion (use bottled or sterile water). Warm tea with lemon and honey can sooth sore throat and cough, as can cough drops.   Return to clinic for new or worse swelling or redness in your face or around your eyes, or if you have a new or higher fever.       A copy of these instructions have been given to the patient or responsible adult who demonstrated the ability to learn, asked appropriate questions, and verbalized understanding of the plan of care.  There were no barriers to learning identified.    Serafina Royals, FNP-C 08/20/20  This note was partially made with the aid of speech-to-text dictation; typographical errors are not  intentional.    Serafina Royals, [facility] 08/20/20 [phone]

## 2020-08-20 NOTE — ED Triage Notes (Addendum)
Pt presents today with c/o "I have a sinus infection". She reports nasal congestion/running nose,sore throat, cough and sinus pressure.Denies fever. X3 days.  Home Covid test yesterday, negative. She declines offer for Covid PCR test today.

## 2020-08-20 NOTE — Discharge Instructions (Addendum)
DO NOT start taking your antibiotic prescription unless symptoms do not improve over the next 1 week.  Sinusitis often follows a cold. It causes pain and pressure in your head and face. Rest, push lots of fluids (especially water), and utilize supportive care for symptoms. Breathe warm, moist area from a steamy shower, hot bath, or sink filled with hot water.  Avoid cold, dry air.  Using a humidifier in your home may help.  Follow the directions for cleaning the machine. Put a hot, wet towel or a warm gel pack on your face 3-4 times a day for 5-10 minutes each time. You may take acetaminophen (Tylenol) every 4-6 hours and ibuprofen every 6-8 hours for muscle pain, joint pain, headaches (you may also alternate these medications). Sudafed (pseudophedrine) is sold behind the counter and can help reduce nasal pressure; avoid taking this if you have high blood pressure or feel jittery. Sudafed PE (phenylephrine) can be a helpful, short-term, over-the-counter alternative to limit side effects or if you have high blood pressure.  Flonase nasal spray can help alleviate congestion and sinus pressure. Many patients choose Afrin as a nasal decongestant; do not use for more than 3 days for risk of rebound (increased symptoms after stopping medication).  Saline nasal sprays or rinses can also help nasal congestion (use bottled or sterile water). Warm tea with lemon and honey can sooth sore throat and cough, as can cough drops.   Return to clinic for new or worse swelling or redness in your face or around your eyes, or if you have a new or higher fever.

## 2021-03-21 ENCOUNTER — Other Ambulatory Visit: Payer: Self-pay | Admitting: Physician Assistant

## 2021-03-21 DIAGNOSIS — R102 Pelvic and perineal pain: Secondary | ICD-10-CM

## 2021-03-26 ENCOUNTER — Ambulatory Visit: Payer: Managed Care, Other (non HMO)

## 2021-04-02 ENCOUNTER — Ambulatory Visit
Admission: RE | Admit: 2021-04-02 | Discharge: 2021-04-02 | Disposition: A | Discharge status: Home / Self Care | Payer: Managed Care, Other (non HMO) | Source: Ambulatory Visit | Attending: Physician Assistant | Admitting: Physician Assistant

## 2021-04-02 ENCOUNTER — Other Ambulatory Visit: Payer: Self-pay

## 2021-04-02 DIAGNOSIS — R102 Pelvic and perineal pain: Secondary | ICD-10-CM | POA: Insufficient documentation

## 2021-10-04 NOTE — Progress Notes (Signed)
Surgical Instructions    Your procedure is scheduled on Monday, 10/14/21.  Report to [facility] Main Entrance "A" at 5:30 A.M., then check in with the Admitting office.  Call this number if you have problems the morning of surgery:  [phone]   If you have any questions prior to your surgery date call [phone]: Open Monday-Friday 8am-4pm    Remember:  Do not eat after midnight the night before your surgery  You may drink clear liquids until 4:30am the morning of your surgery.   Clear liquids allowed are: Water, Non-Citrus Juices (without pulp), Carbonated Beverages, Clear Tea, Black Coffee ONLY (NO MILK, CREAM OR POWDERED CREAMER of any kind), and Gatorade    Take these medicines the morning of surgery with A SIP OF WATER:  omeprazole (PRILOSEC)   As of today, STOP taking any Aspirin (unless otherwise instructed by your surgeon) Aleve, Naproxen, Ibuprofen, Motrin, Advil, Goody's, BC's, all herbal medications, fish oil, and all vitamins.           Do not wear jewelry or makeup. Do not wear lotions, powders, perfumes/cologne or deodorant. Do not shave 48 hours prior to surgery.   Do not bring valuables to the hospital. Do not wear nail polish, gel polish, artificial nails, or any other type of covering on natural nails (fingers and toes) If you have artificial nails or gel coating that need to be removed by a nail salon, please have this removed prior to surgery. Artificial nails or gel coating may interfere with anesthesia's ability to adequately monitor your vital signs.  [facility] is not responsible for any belongings or valuables.    Do NOT Smoke (Tobacco/Vaping)  24 hours prior to your procedure  If you use a CPAP at night, you may bring your mask for your overnight stay.   Contacts, glasses, hearing aids, dentures or partials may not be worn into surgery, please bring cases for these belongings   For patients admitted to the hospital, discharge time will be  determined by your treatment team.   Patients discharged the day of surgery will not be allowed to drive home, and someone needs to stay with them for 24 hours.   SURGICAL WAITING ROOM VISITATION Patients having surgery or a procedure may have no more than 2 support people in the waiting area - these visitors may rotate.   Children under the age of 20 must have an adult with them who is not the patient. If the patient needs to stay at the hospital during part of their recovery, the visitor guidelines for inpatient rooms apply. Pre-op nurse will coordinate an appropriate time for 1 support person to accompany patient in pre-op.  This support person may not rotate.   Please refer to the [facility] website for the visitor guidelines for Inpatients (after your surgery is over and you are in a regular room).    Special instructions:    Oral Hygiene is also important to reduce your risk of infection.  Remember - BRUSH YOUR TEETH THE MORNING OF SURGERY WITH YOUR REGULAR TOOTHPASTE   [facility]- Preparing For Surgery  Before surgery, you can play an important role. Because skin is not sterile, your skin needs to be as free of germs as possible. You can reduce the number of germs on your skin by washing with CHG (chlorahexidine gluconate) Soap before surgery.  CHG is an antiseptic cleaner which kills germs and bonds with the skin to continue killing germs even after washing.  Please do not use if you have an allergy to CHG or antibacterial soaps. If your skin becomes reddened/irritated stop using the CHG.  Do not shave (including legs and underarms) for at least 48 hours prior to first CHG shower. It is OK to shave your face.  Please follow these instructions carefully.     Shower the NIGHT BEFORE SURGERY and the MORNING OF SURGERY with CHG Soap.   If you chose to wash your hair, wash your hair first as usual with your normal shampoo. After you shampoo, rinse your hair and body thoroughly  to remove the shampoo.  Then [employer] and genitals (private parts) with your normal soap and rinse thoroughly to remove soap.  After that Use CHG Soap as you would any other liquid soap. You can apply CHG directly to the skin and wash gently with a scrungie or a clean washcloth.   Apply the CHG Soap to your body ONLY FROM THE NECK DOWN.  Do not use on open wounds or open sores. Avoid contact with your eyes, ears, mouth and genitals (private parts). Wash Face and genitals (private parts)  with your normal soap.   Wash thoroughly, paying special attention to the area where your surgery will be performed.  Thoroughly rinse your body with warm water from the neck down.  DO NOT shower/wash with your normal soap after using and rinsing off the CHG Soap.  Pat yourself dry with a CLEAN TOWEL.  Wear CLEAN PAJAMAS to bed the night before surgery  Place CLEAN SHEETS on your bed the night before your surgery  DO NOT SLEEP WITH PETS.   Day of Surgery: Take a shower with CHG soap. Wear Clean/Comfortable clothing the morning of surgery Do not apply any deodorants/lotions.   Remember to brush your teeth WITH YOUR REGULAR TOOTHPASTE.    If you received a COVID test during your pre-op visit, it is requested that you wear a mask when out in public, stay away from anyone that may not be feeling well, and notify your surgeon if you develop symptoms. If you have been in contact with anyone that has tested positive in the last 10 days, please notify your surgeon.    Please read over the following fact sheets that you were given.

## 2021-10-08 ENCOUNTER — Encounter (HOSPITAL_COMMUNITY)
Admission: RE | Admit: 2021-10-08 | Discharge: 2021-10-08 | Disposition: A | Discharge status: Home / Self Care | Payer: Managed Care, Other (non HMO) | Source: Ambulatory Visit | Attending: Obstetrics and Gynecology | Admitting: Obstetrics and Gynecology

## 2021-10-08 ENCOUNTER — Encounter (HOSPITAL_COMMUNITY): Payer: Self-pay

## 2021-10-08 ENCOUNTER — Other Ambulatory Visit: Payer: Self-pay

## 2021-10-08 VITALS — BP 134/91 | HR 90 | Temp 98.4°F | Resp 18 | Ht 63.0 in | Wt 198.7 lb

## 2021-10-08 DIAGNOSIS — Z01818 Encounter for other preprocedural examination: Secondary | ICD-10-CM

## 2021-10-08 DIAGNOSIS — Z01812 Encounter for preprocedural laboratory examination: Secondary | ICD-10-CM | POA: Diagnosis not present

## 2021-10-08 HISTORY — DX: Essential (primary) hypertension: I10

## 2021-10-08 HISTORY — DX: Gastro-esophageal reflux disease without esophagitis: K21.9

## 2021-10-08 LAB — BASIC METABOLIC PANEL
Anion gap: 9 (ref 5–15)
BUN: 12 mg/dL (ref 6–20)
CO2: 24 mmol/L (ref 22–32)
Calcium: 9.5 mg/dL (ref 8.9–10.3)
Chloride: 104 mmol/L (ref 98–111)
Creatinine, Ser: 0.98 mg/dL (ref 0.44–1.00)
GFR, Estimated: >60 mL/min (ref >60)
Glucose, Bld: 106 mg/dL — ABNORMAL HIGH (ref 70–99)
Potassium: 3.7 mmol/L (ref 3.5–5.1)
Sodium: 137 mmol/L (ref 135–145)

## 2021-10-08 LAB — TYPE AND SCREEN
ABO/RH(D): A POS
Antibody Screen: NEGATIVE

## 2021-10-08 LAB — CBC
HCT: 40.8 % (ref 36.0–46.0)
Hemoglobin: 13.5 g/dL (ref 12.0–15.0)
MCH: 28.5 pg (ref 26.0–34.0)
MCHC: 33.1 g/dL (ref 30.0–36.0)
MCV: 86.3 fL (ref 80.0–100.0)
Platelets: 361 10*3/uL (ref 150–400)
RBC: 4.73 MIL/uL (ref 3.87–5.11)
RDW: 11.9 % (ref 11.5–15.5)
WBC: 9 10*3/uL (ref 4.0–10.5)
nRBC: 0 % (ref 0.0–0.2)

## 2021-10-08 NOTE — Progress Notes (Signed)
PCP - denies Cardiologist - denies  PPM/ICD - denies   Chest x-ray - n/a EKG - pending Stress Test - denies ECHO - denies Cardiac Cath - denies  Sleep Study - denies CPAP - n/a  No diabetes  As of today, STOP taking any Aspirin (unless otherwise instructed by your surgeon) Aleve, Naproxen, Ibuprofen, Motrin, Advil, Goody's, BC's, all herbal medications, fish oil, and all vitamins.  ERAS Protcol - yes PRE-SURGERY Ensure or G2- n/a  COVID TEST- n/a   Anesthesia review: no   Patient denies shortness of breath, fever, cough and chest pain at PAT appointment   All instructions explained to the patient, with a verbal understanding of the material. Patient agrees to go over the instructions while at home for a better understanding. Patient also instructed to self quarantine after being tested for COVID-19. The opportunity to ask questions was provided.

## 2021-10-09 NOTE — Progress Notes (Signed)
Pt called with a question about taking lisinopril prior to her surgery on 9/11. Pt advised to follow the instructions given to her at her pre-op appt., which sts not to take lisinopril the day of surgery.

## 2021-10-13 NOTE — H&P (Signed)
Jill Colon is an 36 y.o. female. She is having mostly regular menses monthly, heavy in the past but better recently.  Has persistent pelvic pain for the past 6 months or so.  Pelvic u/s in April with 9 cm fundal myoma, and 5+ cm posterior myoma, as well as a 4 cm simple left ovarian cyst.  She also has primary infertility, husband with normal semen analysis, she has had normal bloodwork.   Pertinent Gynecological History: Last pap: normal Date: 02/2020 OB History: G0  Menstrual History: Patient's last menstrual period was 09/29/2021 (exact date).    Past Medical History:  Diagnosis Date   COVID-19 02/2020   GERD (gastroesophageal reflux disease)    Hypertension    PCOS (polycystic ovarian syndrome)     Past Surgical History:  Procedure Laterality Date   TONSILLECTOMY  2013   TYMPANOSTOMY TUBE PLACEMENT     Twice during childhood    Family History  Problem Relation Age of Onset   Hypertension Mother    Hyperlipidemia Father    Hypertension Father    Anemia Father     Social History:  reports that she has never smoked. She has never used smokeless tobacco. She reports current alcohol use of about 1.0 standard drink of alcohol per week. She reports that she does not use drugs.  Allergies:  Allergies  Allergen Reactions   Augmentin [Amoxicillin-Pot Clavulanate] Rash    No medications prior to admission.    Review of Systems  Respiratory: Negative.    Cardiovascular: Negative.     Last menstrual period 09/29/2021. Physical Exam Constitutional:      Appearance: Normal appearance.  Cardiovascular:     Rate and Rhythm: Normal rate and regular rhythm.     Heart sounds: Normal heart sounds. No murmur heard. Pulmonary:     Effort: Pulmonary effort is normal. No respiratory distress.     Breath sounds: Normal breath sounds. No wheezing.  Abdominal:     General: There is no distension.     Palpations: Abdomen is soft.     Tenderness: There is no abdominal  tenderness.  Genitourinary:    General: Normal vulva.     Comments: Uterus enlarged and irregular, difficult to discern adnexal mass Musculoskeletal:     Cervical back: Normal range of motion and neck supple.  Neurological:     Mental Status: She is alert.     No results found for this or any previous visit (from the past 24 hour(s)).  No results found.  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  All medical and surgical options discussed, she wants myomectomy and possible ovarian cystectomy.  Surgical procedure, risks, alternatives, chances of relieving symptoms all discussed, questions answered.  Will admit for abdominal myomectomy, possible ovarian cystectomy, chromopertubation.    Blane Ohara Morgon Pamer 10/13/2021, 7:34 PM

## 2021-10-14 ENCOUNTER — Inpatient Hospital Stay (HOSPITAL_COMMUNITY): Payer: Managed Care, Other (non HMO) | Admitting: Certified Registered Nurse Anesthetist

## 2021-10-14 ENCOUNTER — Other Ambulatory Visit: Payer: Self-pay

## 2021-10-14 ENCOUNTER — Inpatient Hospital Stay (HOSPITAL_COMMUNITY)
Admission: RE | Admit: 2021-10-14 | Discharge: 2021-10-16 | DRG: 742 | Disposition: A | Discharge status: Home / Self Care | Payer: Managed Care, Other (non HMO) | Attending: Obstetrics and Gynecology | Admitting: Obstetrics and Gynecology

## 2021-10-14 ENCOUNTER — Encounter (HOSPITAL_COMMUNITY): Payer: Self-pay | Admitting: Obstetrics and Gynecology

## 2021-10-14 ENCOUNTER — Encounter (HOSPITAL_COMMUNITY)
Admission: RE | Disposition: A | Discharge status: Home / Self Care | Payer: Self-pay | Source: Home / Self Care | Attending: Obstetrics and Gynecology

## 2021-10-14 DIAGNOSIS — D649 Anemia, unspecified: Secondary | ICD-10-CM | POA: Diagnosis not present

## 2021-10-14 DIAGNOSIS — K219 Gastro-esophageal reflux disease without esophagitis: Secondary | ICD-10-CM | POA: Diagnosis present

## 2021-10-14 DIAGNOSIS — D270 Benign neoplasm of right ovary: Secondary | ICD-10-CM | POA: Diagnosis present

## 2021-10-14 DIAGNOSIS — D259 Leiomyoma of uterus, unspecified: Secondary | ICD-10-CM | POA: Diagnosis present

## 2021-10-14 DIAGNOSIS — N979 Female infertility, unspecified: Secondary | ICD-10-CM

## 2021-10-14 DIAGNOSIS — I10 Essential (primary) hypertension: Secondary | ICD-10-CM | POA: Diagnosis present

## 2021-10-14 DIAGNOSIS — Z8249 Family history of ischemic heart disease and other diseases of the circulatory system: Secondary | ICD-10-CM

## 2021-10-14 DIAGNOSIS — N83202 Unspecified ovarian cyst, left side: Secondary | ICD-10-CM

## 2021-10-14 DIAGNOSIS — Z9889 Other specified postprocedural states: Secondary | ICD-10-CM

## 2021-10-14 DIAGNOSIS — Z8616 Personal history of COVID-19: Secondary | ICD-10-CM

## 2021-10-14 DIAGNOSIS — D62 Acute posthemorrhagic anemia: Secondary | ICD-10-CM | POA: Diagnosis not present

## 2021-10-14 DIAGNOSIS — I9589 Other hypotension: Secondary | ICD-10-CM | POA: Diagnosis not present

## 2021-10-14 DIAGNOSIS — D251 Intramural leiomyoma of uterus: Principal | ICD-10-CM

## 2021-10-14 HISTORY — PX: MYOMECTOMY: SHX85

## 2021-10-14 HISTORY — PX: CHROMOPERTUBATION: SHX6288

## 2021-10-14 HISTORY — PX: OVARIAN CYST REMOVAL: SHX89

## 2021-10-14 LAB — ABO/RH: ABO/RH(D): A POS

## 2021-10-14 LAB — POCT PREGNANCY, URINE: Preg Test, Ur: NEGATIVE

## 2021-10-14 SURGERY — MYOMECTOMY, ABDOMINAL APPROACH
Anesthesia: General | Site: Cervix

## 2021-10-14 MED ORDER — CEFAZOLIN SODIUM-DEXTROSE 2-4 GM/100ML-% IV SOLN
2.0000 g | INTRAVENOUS | Status: AC
  Administered 2021-10-14: 2 g via INTRAVENOUS
  Filled 2021-10-14: qty 100

## 2021-10-14 MED ORDER — VASOPRESSIN 20 UNIT/ML IV SOLN
INTRAVENOUS | Status: DC | PRN
  Administered 2021-10-14: 13 mL via INTRAMUSCULAR

## 2021-10-14 MED ORDER — LACTATED RINGERS IV SOLN: INTRAVENOUS | Status: DC | PRN

## 2021-10-14 MED ORDER — HYDROMORPHONE HCL 1 MG/ML IJ SOLN
INTRAMUSCULAR | Status: AC
  Filled 2021-10-14: qty 1

## 2021-10-14 MED ORDER — ROCURONIUM BROMIDE 10 MG/ML (PF) SYRINGE
PREFILLED_SYRINGE | INTRAVENOUS | Status: DC | PRN
  Administered 2021-10-14: 20 mg via INTRAVENOUS
  Administered 2021-10-14: 30 mg via INTRAVENOUS
  Administered 2021-10-14: 50 mg via INTRAVENOUS

## 2021-10-14 MED ORDER — FENTANYL CITRATE (PF) 250 MCG/5ML IJ SOLN
INTRAMUSCULAR | Status: AC
  Filled 2021-10-14: qty 5

## 2021-10-14 MED ORDER — SODIUM CHLORIDE (PF) 0.9 % IJ SOLN
INTRAMUSCULAR | Status: AC
  Filled 2021-10-14: qty 50

## 2021-10-14 MED ORDER — HYDROMORPHONE HCL 1 MG/ML IJ SOLN
1.0000 mg | INTRAMUSCULAR | Status: DC | PRN
  Administered 2021-10-14: 1 mg via INTRAVENOUS
  Filled 2021-10-14: qty 1

## 2021-10-14 MED ORDER — HYDROMORPHONE HCL 1 MG/ML IJ SOLN
0.2500 mg | INTRAMUSCULAR | Status: DC | PRN
  Administered 2021-10-14 (×2): 0.5 mg via INTRAVENOUS

## 2021-10-14 MED ORDER — KETOROLAC TROMETHAMINE 30 MG/ML IJ SOLN
INTRAMUSCULAR | Status: AC
  Filled 2021-10-14: qty 1

## 2021-10-14 MED ORDER — ONDANSETRON HCL 4 MG PO TABS: 4.0000 mg | ORAL_TABLET | Freq: Four times a day (QID) | ORAL | Status: DC | PRN

## 2021-10-14 MED ORDER — DEXTROSE-NACL 5-0.45 % IV SOLN: INTRAVENOUS | Status: DC

## 2021-10-14 MED ORDER — ACETAMINOPHEN 500 MG PO TABS: 1000.0000 mg | ORAL_TABLET | ORAL | Status: AC

## 2021-10-14 MED ORDER — ONDANSETRON HCL 4 MG/2ML IJ SOLN
4.0000 mg | Freq: Four times a day (QID) | INTRAMUSCULAR | Status: DC | PRN
  Administered 2021-10-14: 4 mg via INTRAVENOUS
  Filled 2021-10-14: qty 2

## 2021-10-14 MED ORDER — SIMETHICONE 80 MG PO CHEW
80.0000 mg | CHEWABLE_TABLET | Freq: Four times a day (QID) | ORAL | Status: DC | PRN
  Administered 2021-10-14: 80 mg via ORAL
  Filled 2021-10-14: qty 1

## 2021-10-14 MED ORDER — SCOPOLAMINE 1 MG/3DAYS TD PT72: 1.0000 | MEDICATED_PATCH | TRANSDERMAL | Status: DC

## 2021-10-14 MED ORDER — MIDAZOLAM HCL 2 MG/2ML IJ SOLN
INTRAMUSCULAR | Status: AC
  Filled 2021-10-14: qty 2

## 2021-10-14 MED ORDER — KETOROLAC TROMETHAMINE 30 MG/ML IJ SOLN
30.0000 mg | Freq: Four times a day (QID) | INTRAMUSCULAR | Status: AC
  Administered 2021-10-14 – 2021-10-15 (×4): 30 mg via INTRAVENOUS
  Filled 2021-10-14 (×4): qty 1

## 2021-10-14 MED ORDER — GABAPENTIN 300 MG PO CAPS
300.0000 mg | ORAL_CAPSULE | ORAL | Status: AC
  Administered 2021-10-14: 300 mg via ORAL
  Filled 2021-10-14: qty 1

## 2021-10-14 MED ORDER — PROPOFOL 10 MG/ML IV BOLUS
INTRAVENOUS | Status: AC
  Filled 2021-10-14: qty 20

## 2021-10-14 MED ORDER — BUPIVACAINE HCL (PF) 0.5 % IJ SOLN
INTRAMUSCULAR | Status: AC
  Filled 2021-10-14: qty 30

## 2021-10-14 MED ORDER — ACETAMINOPHEN 500 MG PO TABS
1000.0000 mg | ORAL_TABLET | Freq: Once | ORAL | Status: AC
  Administered 2021-10-14: 1000 mg via ORAL
  Filled 2021-10-14: qty 2

## 2021-10-14 MED ORDER — LISINOPRIL 10 MG PO TABS
10.0000 mg | ORAL_TABLET | Freq: Every morning | ORAL | Status: DC
Start: 2021-10-15 — End: 2021-10-15
  Administered 2021-10-15: 10 mg via ORAL
  Filled 2021-10-14: qty 1

## 2021-10-14 MED ORDER — LIDOCAINE 2% (20 MG/ML) 5 ML SYRINGE
INTRAMUSCULAR | Status: DC | PRN
  Administered 2021-10-14: 60 mg via INTRAVENOUS

## 2021-10-14 MED ORDER — BISACODYL 10 MG RE SUPP: 10.0000 mg | Freq: Every day | RECTAL | Status: DC | PRN

## 2021-10-14 MED ORDER — METHYLENE BLUE 1 % INJ SOLN
INTRAVENOUS | Status: AC
  Filled 2021-10-14: qty 10

## 2021-10-14 MED ORDER — PHENYLEPHRINE 80 MCG/ML (10ML) SYRINGE FOR IV PUSH (FOR BLOOD PRESSURE SUPPORT)
PREFILLED_SYRINGE | INTRAVENOUS | Status: DC | PRN
  Administered 2021-10-14: 160 ug via INTRAVENOUS
  Administered 2021-10-14 (×8): 80 ug via INTRAVENOUS

## 2021-10-14 MED ORDER — VASOPRESSIN 20 UNIT/ML IV SOLN
INTRAVENOUS | Status: AC
  Filled 2021-10-14: qty 1

## 2021-10-14 MED ORDER — ACETAMINOPHEN 500 MG PO TABS
1000.0000 mg | ORAL_TABLET | Freq: Four times a day (QID) | ORAL | Status: DC
Start: 2021-10-14 — End: 2021-10-16
  Administered 2021-10-14 – 2021-10-16 (×7): 1000 mg via ORAL
  Filled 2021-10-14 (×8): qty 2

## 2021-10-14 MED ORDER — METHYLENE BLUE 1 % INJ SOLN
INTRAVENOUS | Status: DC | PRN
  Administered 2021-10-14: 1 mL

## 2021-10-14 MED ORDER — EPHEDRINE 5 MG/ML INJ
INTRAVENOUS | Status: AC
  Filled 2021-10-14: qty 5

## 2021-10-14 MED ORDER — PHENYLEPHRINE 80 MCG/ML (10ML) SYRINGE FOR IV PUSH (FOR BLOOD PRESSURE SUPPORT)
PREFILLED_SYRINGE | INTRAVENOUS | Status: AC
Start: 2021-10-14 — End: ?
  Filled 2021-10-14: qty 10

## 2021-10-14 MED ORDER — DEXAMETHASONE SODIUM PHOSPHATE 10 MG/ML IJ SOLN
INTRAMUSCULAR | Status: DC | PRN
  Administered 2021-10-14: 10 mg via INTRAVENOUS

## 2021-10-14 MED ORDER — POVIDONE-IODINE 10 % EX SWAB
2.0000 | Freq: Once | CUTANEOUS | Status: AC
  Administered 2021-10-14: 2 via TOPICAL

## 2021-10-14 MED ORDER — PROPOFOL 10 MG/ML IV BOLUS
INTRAVENOUS | Status: DC | PRN
  Administered 2021-10-14: 150 mg via INTRAVENOUS
  Administered 2021-10-14: 50 mg via INTRAVENOUS

## 2021-10-14 MED ORDER — AMISULPRIDE (ANTIEMETIC) 5 MG/2ML IV SOLN
10.0000 mg | Freq: Once | INTRAVENOUS | Status: DC | PRN
Start: 2021-10-14 — End: 2021-10-14

## 2021-10-14 MED ORDER — IBUPROFEN 600 MG PO TABS
600.0000 mg | ORAL_TABLET | Freq: Four times a day (QID) | ORAL | Status: DC
  Administered 2021-10-15 – 2021-10-16 (×4): 600 mg via ORAL
  Filled 2021-10-14 (×4): qty 1

## 2021-10-14 MED ORDER — OXYCODONE HCL 5 MG PO TABS
5.0000 mg | ORAL_TABLET | ORAL | Status: DC | PRN
  Administered 2021-10-14 – 2021-10-15 (×2): 5 mg via ORAL
  Administered 2021-10-15 – 2021-10-16 (×2): 10 mg via ORAL
  Filled 2021-10-14: qty 2
  Filled 2021-10-14 (×2): qty 1
  Filled 2021-10-14: qty 2
  Filled 2021-10-14: qty 1

## 2021-10-14 MED ORDER — MIDAZOLAM HCL 2 MG/2ML IJ SOLN
INTRAMUSCULAR | Status: DC | PRN
  Administered 2021-10-14: 2 mg via INTRAVENOUS

## 2021-10-14 MED ORDER — ROCURONIUM BROMIDE 10 MG/ML (PF) SYRINGE
PREFILLED_SYRINGE | INTRAVENOUS | Status: AC
  Filled 2021-10-14: qty 10

## 2021-10-14 MED ORDER — ONDANSETRON HCL 4 MG/2ML IJ SOLN
INTRAMUSCULAR | Status: AC
  Filled 2021-10-14: qty 2

## 2021-10-14 MED ORDER — EPHEDRINE SULFATE-NACL 50-0.9 MG/10ML-% IV SOSY
PREFILLED_SYRINGE | INTRAVENOUS | Status: DC | PRN
  Administered 2021-10-14: 5 mg via INTRAVENOUS

## 2021-10-14 MED ORDER — MENTHOL 3 MG MT LOZG: 1.0000 | LOZENGE | OROMUCOSAL | Status: DC | PRN

## 2021-10-14 MED ORDER — SCOPOLAMINE 1 MG/3DAYS TD PT72
1.0000 | MEDICATED_PATCH | TRANSDERMAL | Status: DC
  Administered 2021-10-14: 1.5 mg via TRANSDERMAL
  Filled 2021-10-14: qty 1

## 2021-10-14 MED ORDER — SUGAMMADEX SODIUM 200 MG/2ML IV SOLN
INTRAVENOUS | Status: DC | PRN
  Administered 2021-10-14: 200 mg via INTRAVENOUS

## 2021-10-14 MED ORDER — FENTANYL CITRATE (PF) 250 MCG/5ML IJ SOLN
INTRAMUSCULAR | Status: DC | PRN
  Administered 2021-10-14: 100 ug via INTRAVENOUS
  Administered 2021-10-14: 50 ug via INTRAVENOUS
  Administered 2021-10-14: 100 ug via INTRAVENOUS
  Administered 2021-10-14: 50 ug via INTRAVENOUS

## 2021-10-14 MED ORDER — ALUM & MAG HYDROXIDE-SIMETH 200-200-20 MG/5ML PO SUSP: 30.0000 mL | ORAL | Status: DC | PRN

## 2021-10-14 MED ORDER — KETOROLAC TROMETHAMINE 30 MG/ML IJ SOLN
INTRAMUSCULAR | Status: DC | PRN
  Administered 2021-10-14: 30 mg via INTRAVENOUS

## 2021-10-14 MED ORDER — LACTATED RINGERS IV SOLN: INTRAVENOUS | Status: DC

## 2021-10-14 MED ORDER — LIDOCAINE 2% (20 MG/ML) 5 ML SYRINGE
INTRAMUSCULAR | Status: AC
  Filled 2021-10-14: qty 5

## 2021-10-14 MED ORDER — PANTOPRAZOLE SODIUM 40 MG PO TBEC
40.0000 mg | DELAYED_RELEASE_TABLET | Freq: Every day | ORAL | Status: DC
  Administered 2021-10-15 – 2021-10-16 (×2): 40 mg via ORAL
  Filled 2021-10-14 (×3): qty 1

## 2021-10-14 MED ORDER — ONDANSETRON HCL 4 MG/2ML IJ SOLN
INTRAMUSCULAR | Status: DC | PRN
  Administered 2021-10-14: 4 mg via INTRAVENOUS

## 2021-10-14 MED ORDER — GABAPENTIN 300 MG PO CAPS
300.0000 mg | ORAL_CAPSULE | Freq: Three times a day (TID) | ORAL | Status: DC
  Administered 2021-10-14 – 2021-10-16 (×6): 300 mg via ORAL
  Filled 2021-10-14 (×6): qty 1

## 2021-10-14 MED ORDER — PHENYLEPHRINE 80 MCG/ML (10ML) SYRINGE FOR IV PUSH (FOR BLOOD PRESSURE SUPPORT)
PREFILLED_SYRINGE | INTRAVENOUS | Status: AC
  Filled 2021-10-14: qty 10

## 2021-10-14 MED ORDER — CHLORHEXIDINE GLUCONATE 0.12 % MT SOLN
OROMUCOSAL | Status: AC
  Filled 2021-10-14: qty 15

## 2021-10-14 SURGICAL SUPPLY — 34 items
BARRIER ADHS 3X4 INTERCEED (GAUZE/BANDAGES/DRESSINGS) IMPLANT
CANISTER SUCT 3000ML PPV (MISCELLANEOUS) ×3 IMPLANT
DRSG OPSITE POSTOP 4X10 (GAUZE/BANDAGES/DRESSINGS) ×3 IMPLANT
DURAPREP 26ML APPLICATOR (WOUND CARE) ×3 IMPLANT
GAUZE 4X4 16PLY ~~LOC~~+RFID DBL (SPONGE) IMPLANT
GLOVE BIO SURGEON STRL SZ 6.5 (GLOVE) IMPLANT
GLOVE BIOGEL PI IND STRL 7.0 (GLOVE) ×6 IMPLANT
GLOVE ORTHO TXT STRL SZ7.5 (GLOVE) ×3 IMPLANT
GOWN STRL REUS W/ TWL LRG LVL3 (GOWN DISPOSABLE) ×6 IMPLANT
GOWN STRL REUS W/TWL LRG LVL3 (GOWN DISPOSABLE) ×6
KIT TURNOVER KIT B (KITS) ×3 IMPLANT
NEEDLE HYPO 22GX1.5 SAFETY (NEEDLE) ×3 IMPLANT
NS IRRIG 1000ML POUR BTL (IV SOLUTION) ×3 IMPLANT
PACK ABDOMINAL GYN (CUSTOM PROCEDURE TRAY) ×3 IMPLANT
PAD ARMBOARD 7.5X6 YLW CONV (MISCELLANEOUS) ×3 IMPLANT
PAD OB MATERNITY 4.3X12.25 (PERSONAL CARE ITEMS) ×3 IMPLANT
RETRACTOR WND ALEXIS 25 LRG (MISCELLANEOUS) IMPLANT
RTRCTR WOUND ALEXIS 25CM LRG (MISCELLANEOUS) ×3
SHEET LAVH (DRAPES) IMPLANT
SPECIMEN JAR MEDIUM (MISCELLANEOUS) ×3 IMPLANT
SPONGE T-LAP 18X18 ~~LOC~~+RFID (SPONGE) IMPLANT
SUT PLAIN 2 0 XLH (SUTURE) IMPLANT
SUT VIC AB 0 CT1 18XCR BRD8 (SUTURE) ×6 IMPLANT
SUT VIC AB 0 CT1 27 (SUTURE) ×6
SUT VIC AB 0 CT1 27XBRD ANBCTR (SUTURE) ×6 IMPLANT
SUT VIC AB 0 CT1 36 (SUTURE) IMPLANT
SUT VIC AB 0 CT1 8-18 (SUTURE) ×6
SUT VIC AB 3-0 SH 27 (SUTURE) ×9
SUT VIC AB 3-0 SH 27X BRD (SUTURE) ×6 IMPLANT
SUT VIC AB 4-0 KS 27 (SUTURE) IMPLANT
SYR 5ML LL (SYRINGE) IMPLANT
SYR CONTROL 10ML LL (SYRINGE) ×3 IMPLANT
TOWEL GREEN STERILE FF (TOWEL DISPOSABLE) ×6 IMPLANT
TRAY FOLEY W/BAG SLVR 14FR (SET/KITS/TRAYS/PACK) ×3 IMPLANT

## 2021-10-14 NOTE — Interval H&P Note (Signed)
History and Physical Interval Note:  10/14/2021 7:14 AM  Jill Colon  has presented today for surgery, with the diagnosis of left cyst, infertility, symptomatic myomatous uterus.  The various methods of treatment have been discussed with the patient and family. After consideration of risks, benefits and other options for treatment, the patient has consented to  Procedure(s): ABDOMINAL MYOMECTOMY (N/A) OVARIAN CYSTECTOMY (Left) CHROMOPERTUBATION (Left) as a surgical intervention.  The patient's history has been reviewed, patient examined, no change in status, stable for surgery.  I have reviewed the patient's chart and labs.  Questions were answered to the patient's satisfaction.     Blane Ohara Jennings Stirling

## 2021-10-14 NOTE — TOC CM/SW Note (Signed)
  Transition of Care [facility]) Screening Note   Patient Details  Name: Jill Colon Date of Birth: 1985-08-16   Transition of Care Department [facility]) has reviewed patient and no TOC needs have been identified at this time. We will continue to monitor patient advancement through interdisciplinary progression rounds. If new patient transition needs arise, please place a TOC consult.

## 2021-10-14 NOTE — Transfer of Care (Signed)
Immediate Anesthesia Transfer of Care Note  Patient: CHANTEE CERINO  Procedure(s) Performed: ABDOMINAL MYOMECTOMY with RIGHT OVARIAN BIOPSY (Abdomen) RUPTURE OF LEFT OVARIAN CYST (Left: Abdomen) CHROMOPERTUBATION (Left: Cervix)  Patient Location: PACU  Anesthesia Type:General  Level of Consciousness: awake and drowsy  Airway & Oxygen Therapy: Patient Spontanous Breathing  Post-op Assessment: Report given to RN, Post -op Vital signs reviewed and stable and Patient moving all extremities X 4  Post vital signs: Reviewed and stable  Last Vitals:  Vitals Value Taken Time  BP 113/65   Temp    Pulse 90 10/14/21 1011  Resp 18 10/14/21 1011  SpO2 94 % 10/14/21 1011  Vitals shown include unvalidated device data.  Last Pain:  Vitals:   10/14/21 0606  TempSrc:   PainSc: 0-No pain         Complications: No notable events documented.

## 2021-10-14 NOTE — Anesthesia Procedure Notes (Signed)
Procedure Name: Intubation Date/Time: 10/14/2021 7:40 AM  Performed by: Harden Mo, CRNAPre-anesthesia Checklist: Patient identified, Emergency Drugs available, Suction available and Patient being monitored Patient Re-evaluated:Patient Re-evaluated prior to induction Oxygen Delivery Method: Circle System Utilized Preoxygenation: Pre-oxygenation with 100% oxygen Induction Type: IV induction Ventilation: Mask ventilation without difficulty and Oral airway inserted - appropriate to patient size Laryngoscope Size: Sabra Heck and 2 Grade View: Grade I Tube type: Oral Tube size: 7.0 mm Number of attempts: 1 Airway Equipment and Method: Stylet and Oral airway Placement Confirmation: ETT inserted through vocal cords under direct vision, positive ETCO2 and breath sounds checked- equal and bilateral Secured at: 22 cm Tube secured with: Tape Dental Injury: Teeth and Oropharynx as per pre-operative assessment

## 2021-10-14 NOTE — Anesthesia Preprocedure Evaluation (Addendum)
Anesthesia Evaluation  Patient identified by MRN, date of birth, ID band Patient awake    Reviewed: Allergy & Precautions, NPO status , Patient's Chart, lab work & pertinent test results  Airway Mallampati: IV  TM Distance: >3 FB Neck ROM: Full    Dental  (+) Dental Advisory Given   Pulmonary neg pulmonary ROS,    breath sounds clear to auscultation       Cardiovascular hypertension, Pt. on medications  Rhythm:Regular Rate:Normal     Neuro/Psych negative neurological ROS     GI/Hepatic Neg liver ROS, GERD  ,  Endo/Other  negative endocrine ROS  Renal/GU negative Renal ROS     Musculoskeletal   Abdominal   Peds  Hematology negative hematology ROS (+)   Anesthesia Other Findings   Reproductive/Obstetrics                            Lab Results  Component Value Date   WBC 9.0 10/08/2021   HGB 13.5 10/08/2021   HCT 40.8 10/08/2021   MCV 86.3 10/08/2021   PLT 361 10/08/2021   Lab Results  Component Value Date   CREATININE 0.98 10/08/2021   BUN 12 10/08/2021   NA 137 10/08/2021   K 3.7 10/08/2021   CL 104 10/08/2021   CO2 24 10/08/2021    Anesthesia Physical Anesthesia Plan  ASA: 2  Anesthesia Plan: General   Post-op Pain Management: Tylenol PO (pre-op)*, Gabapentin PO (pre-op)*, Toradol IV (intra-op)* and Ketamine IV*   Induction: Intravenous  PONV Risk Score and Plan: 4 or greater and Scopolamine patch - Pre-op, Midazolam, Dexamethasone, Ondansetron and Treatment may vary due to age or medical condition  Airway Management Planned: Oral ETT  Additional Equipment: None  Intra-op Plan:   Post-operative Plan: Extubation in OR  Informed Consent: I have reviewed the patients History and Physical, chart, labs and discussed the procedure including the risks, benefits and alternatives for the proposed anesthesia with the patient or authorized representative who has indicated  his/her understanding and acceptance.     Dental advisory given  Plan Discussed with: CRNA  Anesthesia Plan Comments:         Anesthesia Quick Evaluation

## 2021-10-14 NOTE — Progress Notes (Signed)
Post-op check, abdominal myomectomy  Doing ok, pain tolerable, no n/v, tol PO Afeb, VSS Adequate clear urine in foley Abd- soft, tender, dressing C/D/I  Continue current care, ambulate this pm, will d/c foley in am

## 2021-10-14 NOTE — Op Note (Signed)
Preoperative diagnosis: Symptomatic leiomyomatous uterus Postoperative diagnosis:  Same  Procedure: Abdominal myomectomy, chromopertubation, drainage of left ovarian cyst, right ovarian biopsy Surgeon: Cheri Fowler M.D. Assistant: Eula Flax, MD Anesthesia: Gen. Endotracheal tube Findings: Enlarged, irregular uterus with 6-7 cm central fundal myoma and a 4 cm left fundal myoma, several tiny posterior myomas and a 2 cm pedunculated myoma, simple left ovarian cyst, irregular surface of right ovaries, patent tubes.  Assistance from Dr. Wilhelmenia Blase was necessary for exposure and to remove myomas Estimated blood loss: 300 cc Specimens:  Myomas, right ovarian biopsy Complications: None  Procedure in detail:  The patient was taken to the operating room and placed in the dorsal supine position. General anesthesia was induced. Abdomen, perineum and vagina were then prepped and draped in the usual sterile fashion and a Foley catheter was inserted. A ZUMI uterine manipulator was inserted.  Abdomen was entered via a standard Pfannenstiel incision without difficulty. The uterus was able to be delivered through the incision once a flexible Alexis retractor was placed. The uterus was then delivered through the incision and one moist lap was used to pack the bowels away from the uterus.  Inspection revealed the above mentioned findings.  Chromopertubation revealed a patent right tube, the left tube appeared to fill but no spill initially.  First I removed the small pedunculated posterior myoma after injecting dilute Pitressin solution.  This was made hemostatic with the Bovie.  There were several tiny posterior myomas that were also removed with Bovie and made hemostatic.  Pitressin solution was then put beneath the serosa fundally across the large fundal myoma.  Incision was made with the Bovie.  Using blunt dissection, hemostat and Bovie eventually we were able to remove this large fibroid.  Small amount of active  bleeding was controlled with Bovie.  Attention was then turned to the left fundal myoma.  Pitressin solution was infiltrated beneath the serosa.  Another incision was made with the Bovie.  This myoma was more superficial and able to be grasped and removed with minimal dissection.  This incision was then closed in 2 layers of interrupted figure-of-eight sutures of 0 Vicryl.  The larger posterior fundal incision was then closed with 2 layers of running 0 Vicryl.  Both of these larger incisions were then closed with baseball stitches of 3-0 Vicryl with adequate closure and adequate hemostasis.  The previously mentioned posterior sites were assured hemostatic hemostatic with Bovie.  At all times I was careful to avoid where the tubes came into the myometrium.  Ovaries were inspected and the right ovary had some irregular areas which were removed with Bovie for biopsy.  The ovary was made hemostatic with Bovie.  The left ovary was enlarged with what appeared to be a simple cyst.  I made an incision into the cyst with the Bovie and clear fluid was drained until the cyst resolved.  No other abnormalities were noted.  Chromopertubation was again performed which then revealed bilateral patent tubes.  The uterine incisions were hemostatic enough that I was able to place Interceed over the incisions after the pelvis was irrigated and the lap pad in the abdomen was removed. The uterus was then gently replaced in the abdomen and the Alexis retractor was removed.  The fascia was closed in running fashion starting at both ends and meeting in the middle with 0 Vicryl. Subcutaneous tissue was irrigated and made hemostatic with electrocautery. Subcutaneous tissue was closed with running 2-0 plain gut suture. Skin was closed with running  subcuticular suture of 4-0 Vicryl followed by Steri-Strips and a sterile bandage. The patient was awakened in the operating room after tolerating the procedure well. She was taken to the recovery room  in stable condition. All counts were correct, she was given Ancef 2 g IV the beginning of the procedure and had PAS hose on throughout the procedure.

## 2021-10-15 ENCOUNTER — Encounter (HOSPITAL_COMMUNITY): Payer: Self-pay | Admitting: Obstetrics and Gynecology

## 2021-10-15 LAB — CBC WITH DIFFERENTIAL/PLATELET
Abs Immature Granulocytes: 0.02 10*3/uL (ref 0.00–0.07)
Basophils Absolute: 0.1 10*3/uL (ref 0.0–0.1)
Basophils Relative: 1 %
Eosinophils Absolute: 0.2 10*3/uL (ref 0.0–0.5)
Eosinophils Relative: 2 %
HCT: 27.3 % — ABNORMAL LOW (ref 36.0–46.0)
Hemoglobin: 8.8 g/dL — ABNORMAL LOW (ref 12.0–15.0)
Immature Granulocytes: 0 %
Lymphocytes Relative: 15 %
Lymphs Abs: 1.2 10*3/uL (ref 0.7–4.0)
MCH: 28.5 pg (ref 26.0–34.0)
MCHC: 32.2 g/dL (ref 30.0–36.0)
MCV: 88.3 fL (ref 80.0–100.0)
Monocytes Absolute: 0.4 10*3/uL (ref 0.1–1.0)
Monocytes Relative: 5 %
Neutro Abs: 6.4 10*3/uL (ref 1.7–7.7)
Neutrophils Relative %: 77 %
Platelets: 213 10*3/uL (ref 150–400)
RBC: 3.09 MIL/uL — ABNORMAL LOW (ref 3.87–5.11)
RDW: 12.8 % (ref 11.5–15.5)
WBC: 8.3 10*3/uL (ref 4.0–10.5)
nRBC: 0 % (ref 0.0–0.2)

## 2021-10-15 LAB — SURGICAL PATHOLOGY

## 2021-10-15 NOTE — Progress Notes (Signed)
Mobility Specialist - Progress Note   10/15/21 1548  Mobility  Activity Ambulated with assistance to bathroom  Level of Assistance Minimal assist, patient does 75% or more  Assistive Device None  Distance Ambulated (ft) 10 ft  Activity Response RN notified  $Mobility charge 1 Mobility    Pt in bed agreeable to mobility. MinA required to help pt move to EOB. While seated on BSC, pt c/o dizziness. Checked BP and notified RN. Rolled pt in recliner back to bed. Left in bed w/ RN at bed side.   Paulla Dolly Mobility Specialist

## 2021-10-15 NOTE — Plan of Care (Signed)
  Problem: Skin Integrity: Goal: Demonstration of wound healing without infection will improve Outcome: Not Progressing   Problem: Activity: Goal: Risk for activity intolerance will decrease Outcome: Not Progressing   Problem: Pain Managment: Goal: General experience of comfort will improve Outcome: Not Progressing

## 2021-10-15 NOTE — Progress Notes (Signed)
   10/15/21 1544  Vitals  Temp 97.8 F (36.6 C)  Temp Source Oral  BP 97/60  MAP (mmHg) 72  BP Location Left Arm  BP Method Automatic  Patient Position (if appropriate) Lying  Pulse Rate 68  Pulse Rate Source Dinamap  Resp 20  Level of Consciousness  Level of Consciousness Alert  MEWS COLOR  MEWS Score Color Green  Oxygen Therapy  SpO2 92 %  O2 Device Room Air  MEWS Score  MEWS Temp 0  MEWS Systolic 1  MEWS Pulse 0  MEWS RR 0  MEWS LOC 0  MEWS Score 1   Pt had a syncopial episode when she got up with the assistance of her partner and the mobility tech to go use the bathroom. Pt assisted back to bed and vitals checked as above

## 2021-10-15 NOTE — Progress Notes (Addendum)
POD #1 abd myomectomy Had been doing ok, but just had a syncopal episode when up.  Feeling better.  Has been tol PO and voiding Afeb, VSS except for hypotension with syncopal episode Abd- soft, tender, incision intact Will check CBC, continue current care, will d/c her zestril since BP has been on the low side since surgery

## 2021-10-15 NOTE — Anesthesia Postprocedure Evaluation (Signed)
Anesthesia Post Note  Patient: Jill Colon  Procedure(s) Performed: ABDOMINAL MYOMECTOMY with RIGHT OVARIAN BIOPSY (Abdomen) RUPTURE OF LEFT OVARIAN CYST (Left: Abdomen) CHROMOPERTUBATION (Left: Cervix)     Patient location during evaluation: PACU Anesthesia Type: General Level of consciousness: awake and alert Pain management: pain level controlled Vital Signs Assessment: post-procedure vital signs reviewed and stable Respiratory status: spontaneous breathing, nonlabored ventilation, respiratory function stable and patient connected to nasal cannula oxygen Cardiovascular status: blood pressure returned to baseline and stable Postop Assessment: no apparent nausea or vomiting Anesthetic complications: no   No notable events documented.  Last Vitals:  Vitals:   10/15/21 0606 10/15/21 0929  BP: (!) 95/55 (!) 106/55  Pulse: 68 77  Resp: 18 16  Temp: 37 C 36.8 C  SpO2: 98% 98%    Last Pain:  Vitals:   10/15/21 0950  TempSrc:   PainSc: Tyler Deis

## 2021-10-15 NOTE — Progress Notes (Signed)
1 Day Post-Op Procedure(s) (LRB): ABDOMINAL MYOMECTOMY with RIGHT OVARIAN BIOPSY (N/A) RUPTURE OF LEFT OVARIAN CYST (Left) CHROMOPERTUBATION (Left)  Subjective: Patient reports incisional pain and tolerating PO.    Objective: I have reviewed patient's vital signs, intake and output, and pathology.  General: alert GI: soft, appropriately tender, dressing C/D/I  Assessment: s/p Procedure(s): ABDOMINAL MYOMECTOMY with RIGHT OVARIAN BIOPSY (N/A) RUPTURE OF LEFT OVARIAN CYST (Left) CHROMOPERTUBATION (Left): stable, progressing well, and tolerating diet  Plan: Encourage ambulation Discontinue IV fluids D/C foley   LOS: 1 day    Clarene Duke, MD 10/15/2021, 8:31 AM

## 2021-10-15 NOTE — Progress Notes (Signed)
MD contacted regarding the above and waiting for return call

## 2021-10-16 MED ORDER — POLYSACCHARIDE IRON COMPLEX 150 MG PO CAPS: 150.0000 mg | ORAL_CAPSULE | Freq: Every day | ORAL | 1 refills | Status: AC

## 2021-10-16 MED ORDER — IBUPROFEN 600 MG PO TABS: 600.0000 mg | ORAL_TABLET | Freq: Four times a day (QID) | ORAL | 0 refills | Status: AC

## 2021-10-16 MED ORDER — OXYCODONE HCL 5 MG PO TABS: 5.0000 mg | ORAL_TABLET | ORAL | 0 refills | Status: AC | PRN

## 2021-10-16 MED ORDER — POLYSACCHARIDE IRON COMPLEX 150 MG PO CAPS
150.0000 mg | ORAL_CAPSULE | Freq: Every day | ORAL | Status: DC
  Filled 2021-10-16: qty 1

## 2021-10-16 NOTE — Discharge Instructions (Signed)
Routine instructions for abdominal myomectomy

## 2021-10-16 NOTE — Progress Notes (Signed)
2 Days Post-Op Procedure(s) (LRB): ABDOMINAL MYOMECTOMY with RIGHT OVARIAN BIOPSY (N/A) RUPTURE OF LEFT OVARIAN CYST (Left) CHROMOPERTUBATION (Left)  Subjective: Patient reports incisional pain, tolerating PO, and no problems voiding.    Objective: I have reviewed patient's vital signs, intake and output, and labs.  General: alert GI: soft, sl tender, incision intact      Latest Ref Rng & Units 10/15/2021    9:09 PM 10/08/2021    9:37 AM 05/21/2016   11:50 AM  CBC  WBC 4.0 - 10.5 K/uL 8.3  9.0  9.1   Hemoglobin 12.0 - 15.0 g/dL 8.8  13.5  12.5   Hematocrit 36.0 - 46.0 % 27.3  40.8  38.5   Platelets 150 - 400 K/uL 213  361  370     Assessment: s/p Procedure(s): ABDOMINAL MYOMECTOMY with RIGHT OVARIAN BIOPSY (N/A) RUPTURE OF LEFT OVARIAN CYST (Left) CHROMOPERTUBATION (Left): stable, progressing well, and anemia-a bit more than I expected but I do not suspect ongoing bleeding  Plan: Discharge home  LOS: 2 days    Clarene Duke, MD 10/16/2021, 8:16 AM

## 2021-10-16 NOTE — Discharge Summary (Signed)
Physician Discharge Summary  Patient ID: Jill Colon MRN: [id number] DOB/AGE: 1985-11-11 36 y.o.  Admit date: 10/14/2021 Discharge date: 10/16/2021  Admission Diagnoses:  Symptomatic myomatous uterus with pelvic pain, left ovarian cyst, primary infertility  Discharge Diagnoses: Same, right ovarian fibroma Principal Problem:   Uterine leiomyoma Active Problems:   S/P myomectomy   Discharged Condition: good  Hospital Course: Admitted for abdominal myomectomy, chromopertubation, rupture of left ovarian cyst.  Procedure under general anesthesia, 350 cc EBL.  Met post-op milestones.  Some lightheadedness evening of POD #1, found to have anemia to Hgb 8.8, better in am of POD #2 with appropriate precautions.  Discharge Exam: Blood pressure 111/67, pulse 77, temperature 97.9 F (36.6 C), temperature source Oral, resp. rate 16, height 5' 3"  (1.6 m), weight 87.1 kg, last menstrual period 09/29/2021, SpO2 93 %. General appearance: alert  Disposition: Discharge disposition: 01-Home or Self Care       Discharge Instructions     Call MD for:  persistant dizziness or light-headedness   Complete by: As directed    Call MD for:  persistant nausea and vomiting   Complete by: As directed    Call MD for:  severe uncontrolled pain   Complete by: As directed    Call MD for:  temperature >100.4   Complete by: As directed    Diet - low sodium heart healthy   Complete by: As directed    Increase activity slowly   Complete by: As directed    Lifting restrictions   Complete by: As directed    10 lbs      Allergies as of 10/16/2021       Reactions   Augmentin [amoxicillin-pot Clavulanate] Rash        Medication List     TAKE these medications    ibuprofen 200 MG tablet Commonly known as: ADVIL Take 800 mg by mouth every 8 (eight) hours as needed (pain.). What changed: Another medication with the same name was added. Make sure you understand how and when to take each.    ibuprofen 600 MG tablet Commonly known as: ADVIL Take 1 tablet (600 mg total) by mouth every 6 (six) hours. What changed: You were already taking a medication with the same name, and this prescription was added. Make sure you understand how and when to take each.   iron polysaccharides 150 MG capsule Commonly known as: NIFEREX Take 1 capsule (150 mg total) by mouth daily.   lisinopril 10 MG tablet Commonly known as: ZESTRIL Take 10 mg by mouth in the morning.   multivitamin with minerals Tabs tablet Take 1 tablet by mouth daily.   omeprazole 20 MG capsule Commonly known as: PRILOSEC Take 20 mg by mouth in the morning.   oxyCODONE 5 MG immediate release tablet Commonly known as: Oxy IR/ROXICODONE Take 1 tablet (5 mg total) by mouth every 4 (four) hours as needed for severe pain.   VITAMIN C PO Take 1 tablet by mouth in the morning.        Follow-up Information     Reace Breshears, MD. Schedule an appointment as soon as possible for a visit in 2 week(s).   Specialty: Obstetrics and Gynecology Contact information: [address], [place] [address] [place] [address] [phone]                 Signed: Blane Ohara Keyra Virella 10/16/2021, 8:21 AM

## 2022-06-12 IMAGING — US US PELVIS COMPLETE WITH TRANSVAGINAL
1 series · 13 of 25 positions shown · non-contrast
Comparison: Ultrasound from 09/04/2007

CLINICAL DATA: Initial evaluation for right-sided pelvic pain.

EXAM:
TRANSABDOMINAL AND TRANSVAGINAL ULTRASOUND OF PELVIS
TECHNIQUE: Both transabdominal and transvaginal ultrasound examinations of the
pelvis were performed. Transabdominal technique was performed for
global imaging of the pelvis including uterus, ovaries, adnexal
regions, and pelvic cul-de-sac. It was necessary to proceed with
endovaginal exam following the transabdominal exam to visualize the
endometrium and ovaries.

[Series 1: us pelvis complete with transvaginal · 0.25mm/px · 13 of 140 slices shown]
[im 1/140]
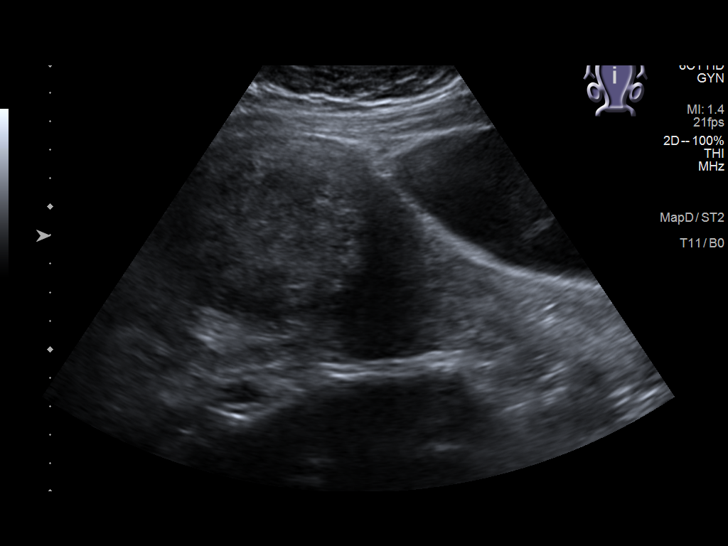
[im 12/140]
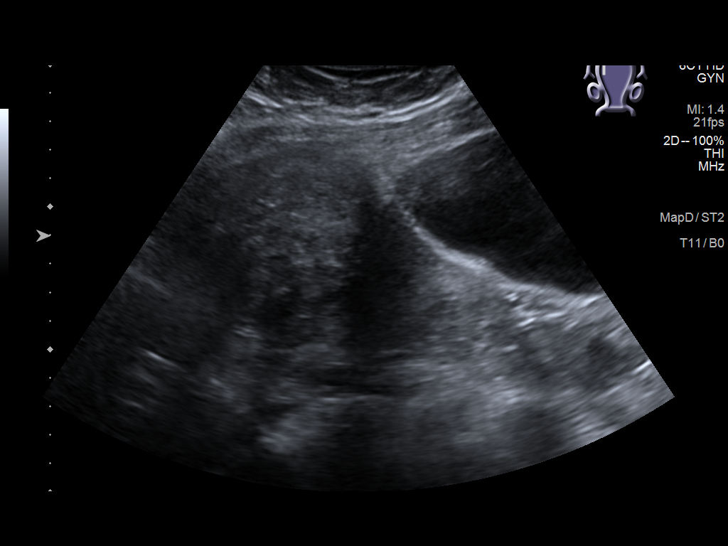
[im 24/140]
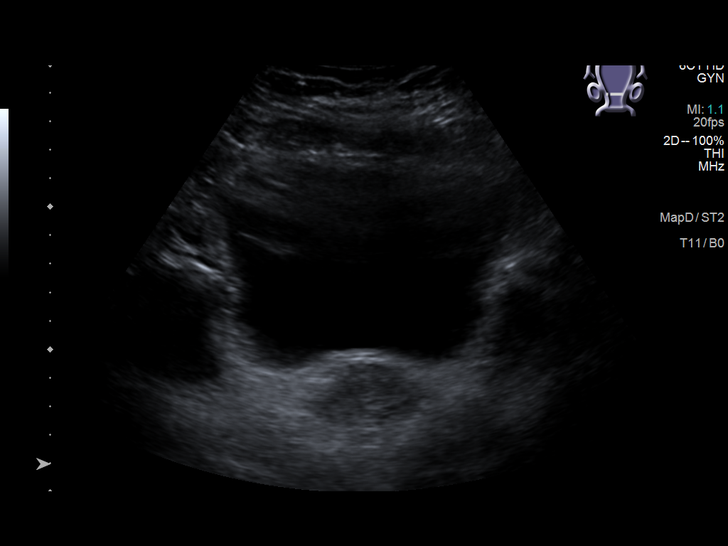
[im 35/140]
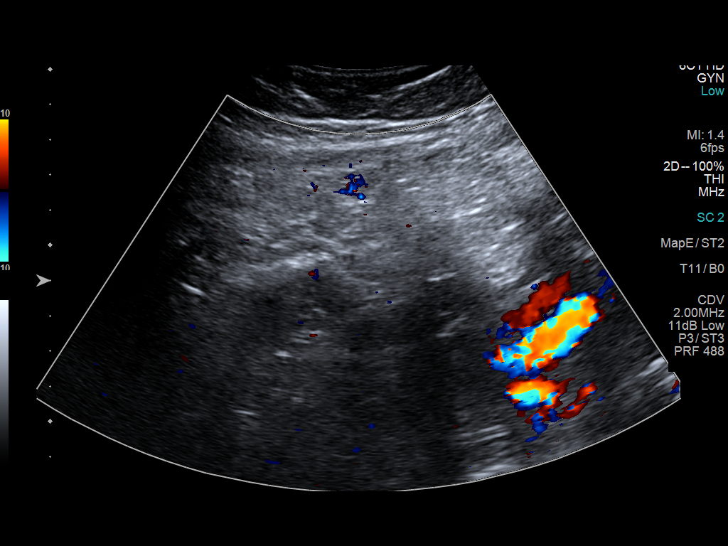
[im 47/140]
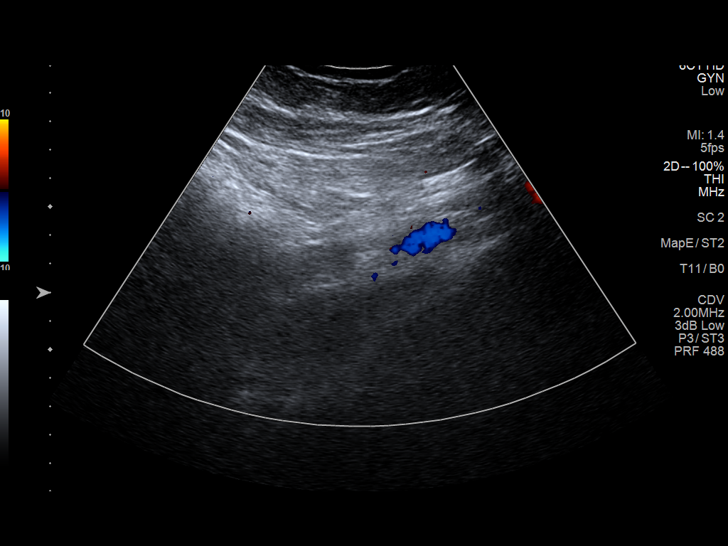
[im 58/140]
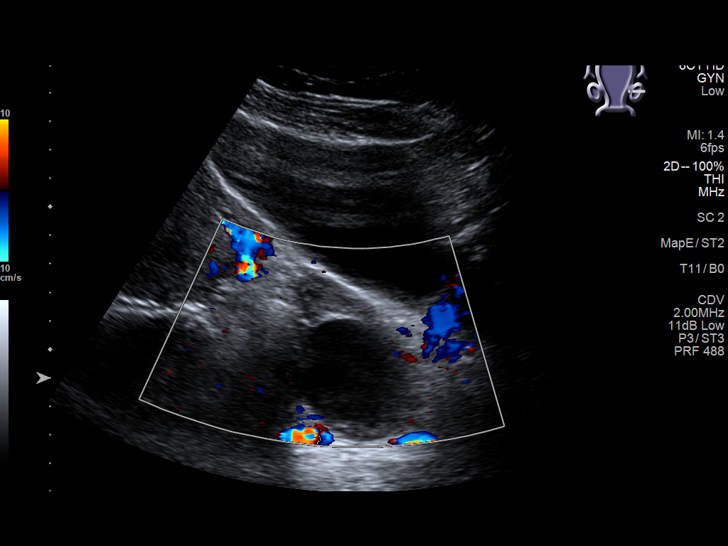
[im 70/140]
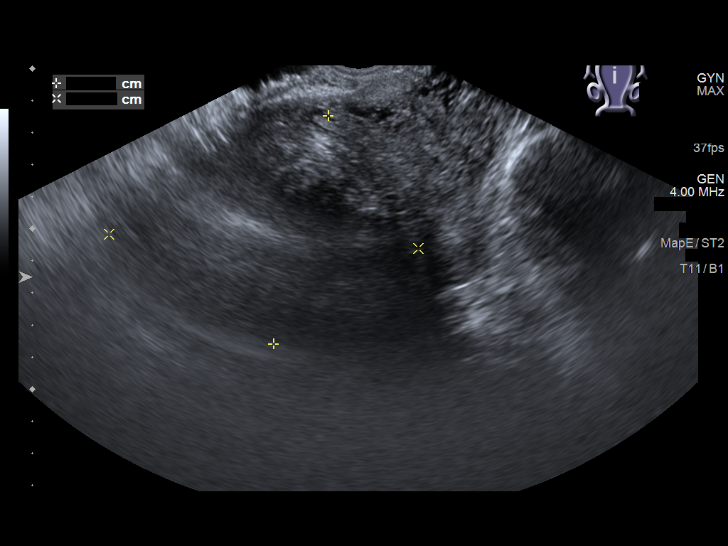
[im 82/140]
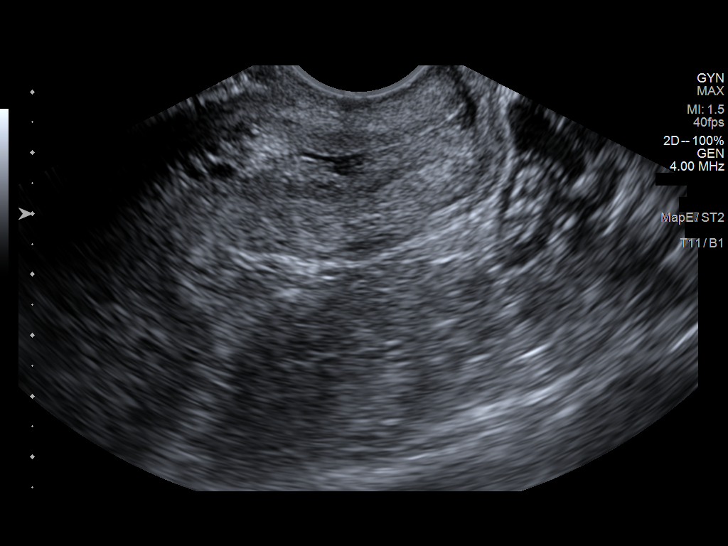
[im 93/140]
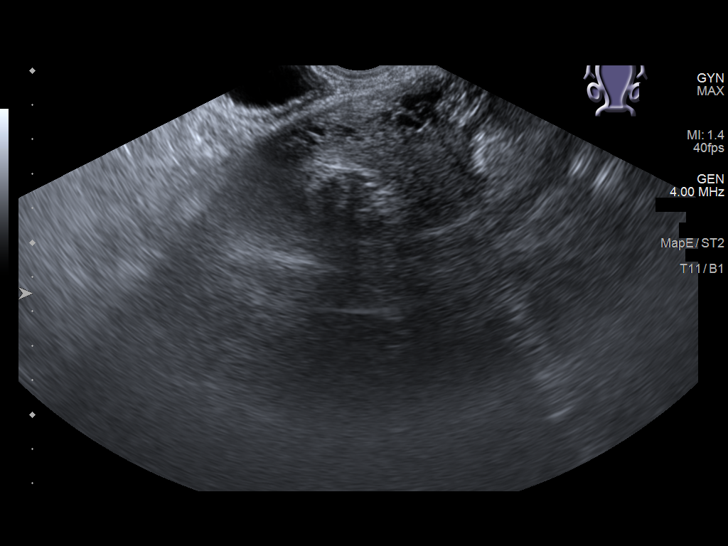
[im 105/140]
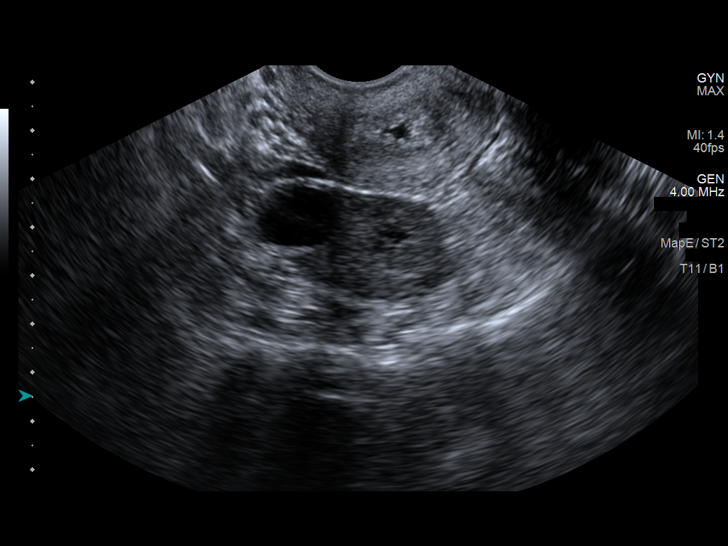
[im 116/140]
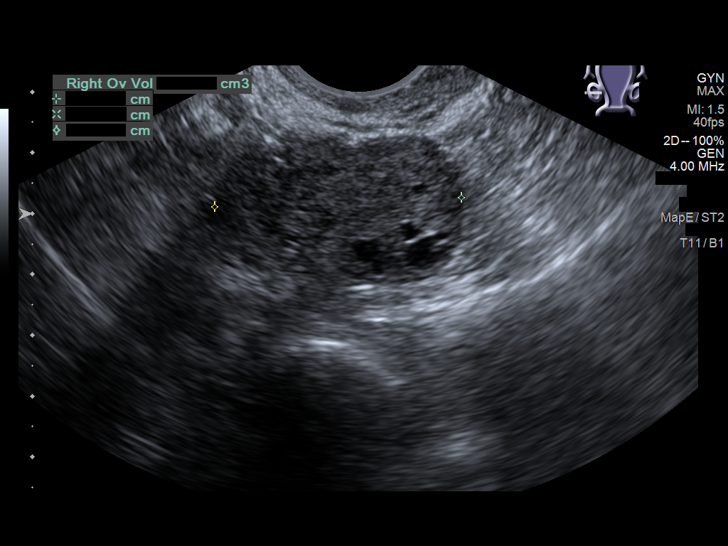
[im 128/140]
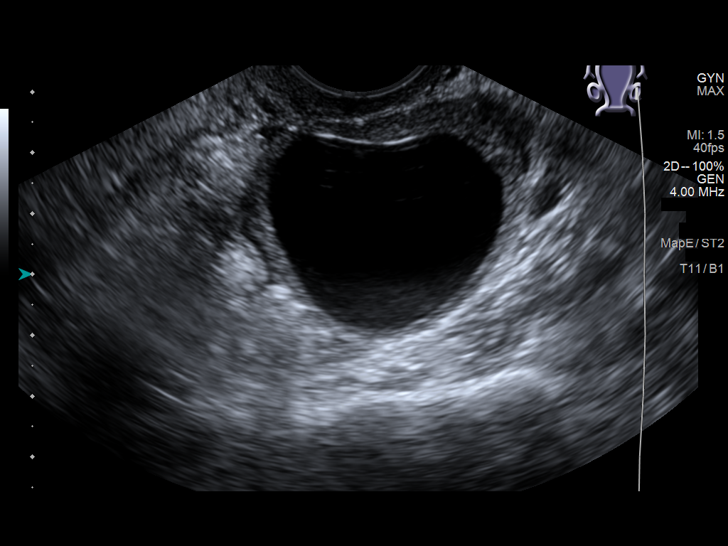
[im 140/140]
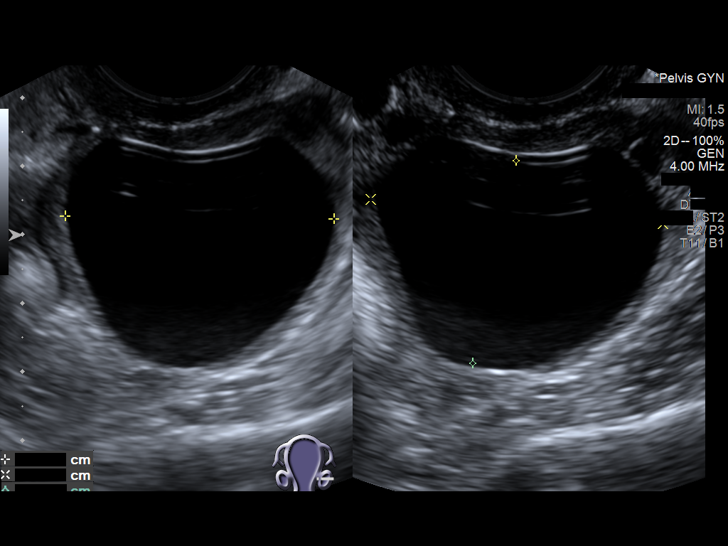

[13 of 25 positions shown; findings below may reference images not displayed]

FINDINGS: Uterus

Measurements: 16.0 x 8.2 x 11.0 cm = volume: 750.5 mL. Uterus is
anteverted. Suspected intramural to submucosal fibroid positioned at
the central uterine body measures approximately 6.9 x 6.5 x 5.4 cm.

Endometrium

Thickness: 10 mm. Endometrial complex somewhat difficult to
visualize due to overlying fibroids. Visualized portions within
normal limits without focal abnormality.

Right ovary

Measurements: 4.3 x 2.3 x 4.1 cm = volume: 21.5 mL. Normal
appearance/no adnexal mass.

Left ovary

Measurements: 4.6 x 4.5 x 4.5 cm = volume: 48.2 mL. 4.3 x 3.9 x
cm mildly complex cyst. Cyst demonstrates a few scattered low-level
internal echoes with internal layering echogenic material/debris. No
visible vascularity or solid component.

Other findings

No abnormal free fluid.
IMPRESSION: 1. 4.3 cm mildly complex left ovarian cyst, nonspecific, but most
likely a mildly complex follicular cyst or hemorrhagic cyst. While
this lesion demonstrates benign characteristics, a short interval
follow-up ultrasound in 6-12 weeks to ensure resolution could be
performed as clinically warranted.
2. 6.9 cm intramural to submucosal fibroid at the central uterine
body.
3. Otherwise unremarkable and normal pelvic ultrasound.

## 2022-12-19 ENCOUNTER — Ambulatory Visit
Admission: EM | Admit: 2022-12-19 | Discharge: 2022-12-19 | Disposition: A | Discharge status: Home / Self Care | Payer: Managed Care, Other (non HMO) | Attending: Emergency Medicine | Admitting: Emergency Medicine

## 2022-12-19 DIAGNOSIS — J069 Acute upper respiratory infection, unspecified: Secondary | ICD-10-CM

## 2022-12-19 LAB — POC COVID19/FLU A&B COMBO
Covid Antigen, POC: NEGATIVE
Influenza A Antigen, POC: NEGATIVE
Influenza B Antigen, POC: NEGATIVE

## 2022-12-19 MED ORDER — ALBUTEROL SULFATE HFA 108 (90 BASE) MCG/ACT IN AERS: 1.0000 | INHALATION_SPRAY | Freq: Four times a day (QID) | RESPIRATORY_TRACT | 0 refills | Status: AC | PRN

## 2022-12-19 MED ORDER — ACETAMINOPHEN 325 MG PO TABS
650.0000 mg | ORAL_TABLET | Freq: Once | ORAL | Status: AC
  Administered 2022-12-19: 650 mg via ORAL

## 2022-12-19 MED ORDER — PREDNISONE 10 MG (21) PO TBPK: ORAL_TABLET | Freq: Every day | ORAL | 0 refills | Status: AC

## 2022-12-19 NOTE — ED Provider Notes (Signed)
Renaldo Fiddler    CSN: [id number] Arrival date & time: 12/19/22  0831      History   Chief Complaint Chief Complaint  Patient presents with   Cough   Fever    HPI ARANSA KRAATZ is a 37 y.o. female. Patient presents with 2 day history of low grade fever and cough.  Her nasal mucous is green and she states "I know I have an infection."  No OTC medications taken.  She denies sore throat, shortness of breath, chest pain, or other symptoms.  She had an albuterol nebulizer treatment this morning which was given to her by her husband who works for [employer].  She does not have a history of asthma or lung disease.  Her medical history includes hypertension.   The history is provided by the patient and medical records.    Past Medical History:  Diagnosis Date   COVID-19 02/2020   GERD (gastroesophageal reflux disease)    Hypertension    PCOS (polycystic ovarian syndrome)     Patient Active Problem List   Diagnosis Date Noted   Uterine leiomyoma 10/14/2021   S/P myomectomy 10/14/2021   Nasal sinus congestion 04/27/2015   Encounter to establish care with new doctor 04/13/2015    Past Surgical History:  Procedure Laterality Date   CHROMOPERTUBATION Left 10/14/2021   Procedure: CHROMOPERTUBATION;  Surgeon: Lavina Hamman, MD;  Location: [facility] OR;  Service: Gynecology;  Laterality: Left;   MYOMECTOMY N/A 10/14/2021   Procedure: ABDOMINAL MYOMECTOMY with RIGHT OVARIAN BIOPSY;  Surgeon: Lavina Hamman, MD;  Location: [facility] OR;  Service: Gynecology;  Laterality: N/A;   OVARIAN CYST REMOVAL Left 10/14/2021   Procedure: RUPTURE OF LEFT OVARIAN CYST;  Surgeon: Lavina Hamman, MD;  Location: [facility] OR;  Service: Gynecology;  Laterality: Left;   TONSILLECTOMY  2013   TYMPANOSTOMY TUBE PLACEMENT     Twice during childhood    OB History   No obstetric history on file.      Home Medications    Prior to Admission medications   Medication Sig Start Date End Date Taking? Authorizing  Provider  albuterol (VENTOLIN HFA) 108 (90 Base) MCG/ACT inhaler Inhale 1-2 puffs into the lungs every 6 (six) hours as needed. 12/19/22  Yes Mickie Bail, NP  predniSONE (STERAPRED UNI-PAK 21 TAB) 10 MG (21) TBPK tablet Take by mouth daily. As directed 12/19/22  Yes Mickie Bail, NP  Ascorbic Acid (VITAMIN C PO) Take 1 tablet by mouth in the morning.    [provider]  ibuprofen (ADVIL) 200 MG tablet Take 800 mg by mouth every 8 (eight) hours as needed (pain.). Patient not taking: Reported on 12/19/2022    [provider]  ibuprofen (ADVIL) 600 MG tablet Take 1 tablet (600 mg total) by mouth every 6 (six) hours. Patient not taking: Reported on 12/19/2022 10/16/21   Meisinger, Tawanna Cooler, MD  iron polysaccharides (NIFEREX) 150 MG capsule Take 1 capsule (150 mg total) by mouth daily. 10/16/21   Meisinger, Todd, MD  lisinopril (ZESTRIL) 10 MG tablet Take 10 mg by mouth in the morning.    [provider]  Multiple Vitamin (MULTIVITAMIN WITH MINERALS) TABS tablet Take 1 tablet by mouth daily.    [provider]  omeprazole (PRILOSEC) 20 MG capsule Take 20 mg by mouth in the morning.    [provider]  oxyCODONE (OXY IR/ROXICODONE) 5 MG immediate release tablet Take 1 tablet (5 mg total) by mouth every 4 (four) hours as needed for  severe pain. Patient not taking: Reported on 12/19/2022 10/16/21   Lavina Hamman, MD    Family History Family History  Problem Relation Age of Onset   Hypertension Mother    Hyperlipidemia Father    Hypertension Father    Anemia Father     Social History Social History   Tobacco Use   Smoking status: Never   Smokeless tobacco: Never  Vaping Use   Vaping status: Never Used  Substance Use Topics   Alcohol use: Yes    Alcohol/week: 1.0 standard drink of alcohol    Types: 1 Glasses of wine per week   Drug use: No     Allergies   Augmentin [amoxicillin-pot clavulanate] and Tape   Review of Systems Review of  Systems  Constitutional:  Positive for fever. Negative for chills.  HENT:  Positive for congestion and rhinorrhea. Negative for ear pain and sore throat.   Respiratory:  Positive for cough. Negative for shortness of breath.   Cardiovascular:  Negative for chest pain and palpitations.  Gastrointestinal:  Negative for diarrhea and vomiting.     Physical Exam Triage Vital Signs ED Triage Vitals  Encounter Vitals Group     BP      Systolic BP Percentile      Diastolic BP Percentile      Pulse      Resp      Temp      Temp src      SpO2      Weight      Height      Head Circumference      Peak Flow      Pain Score      Pain Loc      Pain Education      Exclude from Growth Chart    No data found.  Updated Vital Signs BP 128/85   Pulse (!) 126   Temp 99.3 F (37.4 C)   Resp 18   LMP 12/02/2022   SpO2 96%   Visual Acuity Right Eye Distance:   Left Eye Distance:   Bilateral Distance:    Right Eye Near:   Left Eye Near:    Bilateral Near:     Physical Exam Constitutional:      General: She is not in acute distress. HENT:     Right Ear: Tympanic membrane normal.     Left Ear: Tympanic membrane normal.     Nose: Rhinorrhea present.     Mouth/Throat:     Mouth: Mucous membranes are moist.     Pharynx: Oropharynx is clear.  Cardiovascular:     Rate and Rhythm: Regular rhythm. Tachycardia present.     Heart sounds: Normal heart sounds.  Pulmonary:     Effort: Pulmonary effort is normal. No respiratory distress.     Breath sounds: Normal breath sounds.  Skin:    General: Skin is warm and dry.  Neurological:     Mental Status: She is alert.      UC Treatments / Results  Labs (all labs ordered are listed, but only abnormal results are displayed) Labs Reviewed  POC COVID19/FLU A&B COMBO    EKG   Radiology No results found.  Procedures Procedures (including critical care time)  Medications Ordered in UC Medications  acetaminophen (TYLENOL)  tablet 650 mg (650 mg Oral Given 12/19/22 0853)    Initial Impression / Assessment and Plan / UC Course  I have reviewed the triage vital signs and the nursing notes.  Pertinent labs & imaging results that were available during my care of the patient were reviewed by me and considered in my medical decision making (see chart for details).   Viral URI.  Patient has been symptomatic for 2 days.  Rapid flu and COVID-negative.  Tylenol given here for temp of 100.6.  Treating today with albuterol inhaler and prednisone.  Tylenol or ibuprofen as needed.  Mucinex as needed.  Education provided on viral URI.  Instructed patient to follow up with her PCP if her symptoms are not improving.  She agrees to plan of care.   Final Clinical Impressions(s) / UC Diagnoses   Final diagnoses:  Viral URI     Discharge Instructions      Use the albuterol inhaler and take the prednisone as directed.    The COVID and flu tests are negative.   Take Tylenol or ibuprofen as needed for fever or discomfort.  Take plain Mucinex as needed for congestion.  Rest and keep yourself hydrated.    Follow-up with your primary care provider if your symptoms are not improving.         ED Prescriptions     Medication Sig Dispense Auth. Provider   albuterol (VENTOLIN HFA) 108 (90 Base) MCG/ACT inhaler Inhale 1-2 puffs into the lungs every 6 (six) hours as needed. 18 g Mickie Bail, NP   predniSONE (STERAPRED UNI-PAK 21 TAB) 10 MG (21) TBPK tablet Take by mouth daily. As directed 21 tablet Mickie Bail, NP      PDMP not reviewed this encounter.   Mickie Bail, NP 12/19/22 [phone]

## 2022-12-19 NOTE — ED Triage Notes (Signed)
Patient to Urgent Care with complaints of cough and fevers. Max temp 100. Cough is productive with green mucus. Some chest tightness- has been using her neb tx.  Symptoms started two days ago.  OTC: No meds.

## 2022-12-19 NOTE — Discharge Instructions (Addendum)
Use the albuterol inhaler and take the prednisone as directed.    The COVID and flu tests are negative.   Take Tylenol or ibuprofen as needed for fever or discomfort.  Take plain Mucinex as needed for congestion.  Rest and keep yourself hydrated.    Follow-up with your primary care provider if your symptoms are not improving.
